# Patient Record
Sex: Male | Born: 1970
Health system: Southern US, Community
[De-identification: ages and names within clinical notes are randomized; demographics above are authoritative.]

## PROBLEM LIST (undated history)

## (undated) DIAGNOSIS — I441 Atrioventricular block, second degree: Secondary | ICD-10-CM

## (undated) DIAGNOSIS — E041 Nontoxic single thyroid nodule: Secondary | ICD-10-CM

## (undated) DIAGNOSIS — Z8639 Personal history of other endocrine, nutritional and metabolic disease: Secondary | ICD-10-CM

## (undated) DIAGNOSIS — I483 Typical atrial flutter: Secondary | ICD-10-CM

## (undated) DIAGNOSIS — M1990 Unspecified osteoarthritis, unspecified site: Secondary | ICD-10-CM

## (undated) DIAGNOSIS — I517 Cardiomegaly: Secondary | ICD-10-CM

## (undated) DIAGNOSIS — I442 Atrioventricular block, complete: Secondary | ICD-10-CM

## (undated) DIAGNOSIS — F419 Anxiety disorder, unspecified: Secondary | ICD-10-CM

## (undated) DIAGNOSIS — I429 Cardiomyopathy, unspecified: Secondary | ICD-10-CM

## (undated) DIAGNOSIS — R7303 Prediabetes: Secondary | ICD-10-CM

## (undated) HISTORY — PX: NO PAST SURGERIES: SHX2092

## (undated) HISTORY — PX: PROSTATE BIOPSY: SHX241

## (undated) HISTORY — DX: Typical atrial flutter: I48.3

## (undated) HISTORY — PX: BIOPSY THYROID: PRO38

---

## 2006-09-17 DIAGNOSIS — J309 Allergic rhinitis, unspecified: Secondary | ICD-10-CM | POA: Insufficient documentation

## 2006-09-19 DIAGNOSIS — I809 Phlebitis and thrombophlebitis of unspecified site: Secondary | ICD-10-CM | POA: Insufficient documentation

## 2007-04-12 ENCOUNTER — Ambulatory Visit: Payer: Self-pay | Admitting: Family Medicine

## 2014-02-06 LAB — LIPID PANEL
CHOLESTEROL: 164 mg/dL (ref 0–200)
HDL: 71 mg/dL — AB (ref 35–70)
LDL CALC: 86 mg/dL
LDl/HDL Ratio: 1.2
TRIGLYCERIDES: 37 mg/dL — AB (ref 40–160)

## 2014-02-06 LAB — CBC AND DIFFERENTIAL
HEMATOCRIT: 38 % — AB (ref 41–53)
Hemoglobin: 13.2 g/dL — AB (ref 13.5–17.5)
Neutrophils Absolute: 3 /uL
Platelets: 200 10*3/uL (ref 150–399)
WBC: 4.9 10*3/mL

## 2014-02-06 LAB — TSH: TSH: 1.25 u[IU]/mL (ref 0.41–5.90)

## 2014-02-10 ENCOUNTER — Ambulatory Visit: Payer: Self-pay | Admitting: Family Medicine

## 2014-02-24 ENCOUNTER — Ambulatory Visit: Payer: Self-pay | Admitting: Otolaryngology

## 2014-03-17 LAB — BASIC METABOLIC PANEL
BUN: 22 mg/dL — AB (ref 4–21)
Creatinine: 1.1 mg/dL (ref 0.6–1.3)
GLUCOSE: 96 mg/dL
POTASSIUM: 5.2 mmol/L (ref 3.4–5.3)
Sodium: 139 mmol/L (ref 137–147)

## 2014-03-17 LAB — HEPATIC FUNCTION PANEL
ALK PHOS: 53 U/L (ref 25–125)
ALT: 19 U/L (ref 10–40)
AST: 21 U/L (ref 14–40)
Bilirubin, Total: 0.4 mg/dL

## 2016-01-05 ENCOUNTER — Ambulatory Visit (INDEPENDENT_AMBULATORY_CARE_PROVIDER_SITE_OTHER): Payer: BLUE CROSS/BLUE SHIELD | Admitting: Family Medicine

## 2016-01-05 ENCOUNTER — Encounter: Payer: Self-pay | Admitting: Family Medicine

## 2016-01-05 VITALS — BP 100/68 | HR 82 | Temp 97.8°F | Resp 16 | Ht 75.0 in | Wt 205.0 lb

## 2016-01-05 DIAGNOSIS — E010 Iodine-deficiency related diffuse (endemic) goiter: Secondary | ICD-10-CM | POA: Insufficient documentation

## 2016-01-05 DIAGNOSIS — Z0000 Encounter for general adult medical examination without abnormal findings: Secondary | ICD-10-CM | POA: Diagnosis not present

## 2016-01-05 DIAGNOSIS — I517 Cardiomegaly: Secondary | ICD-10-CM | POA: Insufficient documentation

## 2016-01-05 DIAGNOSIS — Z125 Encounter for screening for malignant neoplasm of prostate: Secondary | ICD-10-CM | POA: Diagnosis not present

## 2016-01-05 DIAGNOSIS — Z23 Encounter for immunization: Secondary | ICD-10-CM

## 2016-01-05 LAB — POCT URINALYSIS DIPSTICK
Bilirubin, UA: NEGATIVE
Blood, UA: NEGATIVE
GLUCOSE UA: NEGATIVE
KETONES UA: NEGATIVE
LEUKOCYTES UA: NEGATIVE
Nitrite, UA: NEGATIVE
Protein, UA: NEGATIVE
SPEC GRAV UA: 1.025
Urobilinogen, UA: 0.2
pH, UA: 6

## 2016-01-05 NOTE — Progress Notes (Addendum)
Patient: Trevor Parker, Male    DOB: [birth year], 45 y.o.   MRN: [id number] Visit Date: 01/05/2016  Today's Provider: Megan Mans, MD   Chief Complaint  Patient presents with  . Annual Exam   Subjective:    Annual physical exam Trevor Parker is a 45 y.o. male who presents today for health maintenance and complete physical. He feels well. He reports exercising every day. He reports he is sleeping well.  -----------------------------------------------------------------   Review of Systems  Constitutional: Negative.   HENT: Negative.   Eyes: Negative.   Respiratory: Negative.   Cardiovascular: Negative.   Gastrointestinal: Negative.   Endocrine: Negative.   Genitourinary: Negative.   Musculoskeletal: Negative.   Skin: Negative.   Allergic/Immunologic: Negative.   Neurological: Negative.   Hematological: Negative.   Psychiatric/Behavioral: Negative.     Social History      He  reports that he has never smoked. He has never used smokeless tobacco. He reports that he does not drink alcohol.       Social History   Social History  . Marital status: Single    Spouse name: N/A  . Number of children: N/A  . Years of education: N/A   Social History Main Topics  . Smoking status: Never Smoker  . Smokeless tobacco: Never Used  . Alcohol use No  . Drug use: Unknown  . Sexual activity: Not Asked   Other Topics Concern  . None   Social History Narrative  . None    History reviewed. No pertinent past medical history.   Patient Active Problem List   Diagnosis Date Noted  . Big thyroid 01/05/2016  . Left ventricular hypertrophy 01/05/2016  . 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 Allergic rhinitis 09/17/2006    Past Surgical History:  Procedure Laterality Date  . NO PAST SURGERIES      Family History        Family Status  Relation Status  . Mother Alive  . Father Alive  . Brother Deceased   due to suicide        His family  history includes Breast cancer in his father; Suicidality in his brother.    No Known Allergies  No outpatient prescriptions have been marked as taking for the 01/05/16 encounter (Office Visit) with Maple Hudson., MD.    Patient Care Team: Maple Hudson., MD as PCP - General (Family Medicine)     Objective:   Vitals: BP 100/68 (BP Location: Left Arm, Patient Position: Sitting, Cuff Size: Large)   Pulse 82   Temp 97.8 F (36.6 C) (Oral)   Resp 16   Ht 6\' 3"  (1.905 m)   Wt 205 lb (93 kg)   BMI 25.62 kg/m    Physical Exam  Constitutional: He is oriented to person, place, and time. He appears well-developed and well-nourished.  HENT:  Head: Normocephalic and atraumatic.  Right Ear: External ear normal.  Left Ear: External ear normal.  Nose: Nose normal.  Mouth/Throat: Oropharynx is clear and moist.  Mild thyroid enlargement. No obvious nodules. Little bit more in the isthmus   Eyes: Conjunctivae and EOM are normal. Pupils are equal, round, and reactive to light.  Neck: Normal range of motion. Neck supple.  Cardiovascular: Normal rate, regular rhythm, normal heart sounds and intact distal pulses.   Pulmonary/Chest: Effort normal and breath sounds normal.  Abdominal: Soft. Bowel sounds are normal.  Genitourinary: Rectum normal, prostate normal and penis normal.  Musculoskeletal: Normal range of motion.  Neurological: He is alert and oriented to person, place, and time. He has normal reflexes.  Skin: Skin is warm and dry.  Psychiatric: He has a normal mood and affect. His behavior is normal. Judgment and thought content normal.     Depression Screen No flowsheet data found.    Assessment & Plan:     Routine Health Maintenance and Physical Exam  Exercise Activities and Dietary recommendations Goals    None       There is no immunization history on file for this patient.  Health Maintenance  Topic Date Due  . HIV Screening  08/27/1983  .  TETANUS/TDAP  08/27/1987  . INFLUENZA VACCINE  01/04/2016     Excellent health. Return to clinic 1 year Discussed health benefits of physical activity, and encouraged him to engage in regular exercise appropriate for his age and condition.    --------------------------------------------------------------------    Megan Mans, MD  [facility]

## 2016-01-06 LAB — PSA: PROSTATE SPECIFIC AG, SERUM: 2.7 ng/mL (ref 0.0–4.0)

## 2016-01-06 LAB — CBC WITH DIFFERENTIAL/PLATELET
BASOS: 1 %
Basophils Absolute: 0 10*3/uL (ref 0.0–0.2)
EOS (ABSOLUTE): 0.1 10*3/uL (ref 0.0–0.4)
EOS: 2 %
HEMATOCRIT: 41.6 % (ref 37.5–51.0)
Hemoglobin: 14.1 g/dL (ref 12.6–17.7)
IMMATURE GRANULOCYTES: 0 %
Immature Grans (Abs): 0 10*3/uL (ref 0.0–0.1)
LYMPHS ABS: 1.2 10*3/uL (ref 0.7–3.1)
Lymphs: 20 %
MCH: 29.7 pg (ref 26.6–33.0)
MCHC: 33.9 g/dL (ref 31.5–35.7)
MCV: 88 fL (ref 79–97)
MONOS ABS: 0.3 10*3/uL (ref 0.1–0.9)
Monocytes: 5 %
NEUTROS ABS: 4.4 10*3/uL (ref 1.4–7.0)
Neutrophils: 72 %
Platelets: 206 10*3/uL (ref 150–379)
RBC: 4.75 x10E6/uL (ref 4.14–5.80)
RDW: 14.6 % (ref 12.3–15.4)
WBC: 6.2 10*3/uL (ref 3.4–10.8)

## 2016-01-06 LAB — COMPREHENSIVE METABOLIC PANEL
ALT: 53 IU/L — AB (ref 0–44)
AST: 22 IU/L (ref 0–40)
Albumin/Globulin Ratio: 1.7 (ref 1.2–2.2)
Albumin: 4.8 g/dL (ref 3.5–5.5)
Alkaline Phosphatase: 61 IU/L (ref 39–117)
BUN/Creatinine Ratio: 22 — ABNORMAL HIGH (ref 9–20)
BUN: 25 mg/dL — AB (ref 6–24)
Bilirubin Total: 0.6 mg/dL (ref 0.0–1.2)
CALCIUM: 9.6 mg/dL (ref 8.7–10.2)
CO2: 23 mmol/L (ref 18–29)
CREATININE: 1.14 mg/dL (ref 0.76–1.27)
Chloride: 102 mmol/L (ref 96–106)
GFR, EST AFRICAN AMERICAN: 88 mL/min/{1.73_m2} (ref >59)
GFR, EST NON AFRICAN AMERICAN: 76 mL/min/{1.73_m2} (ref >59)
GLUCOSE: 89 mg/dL (ref 65–99)
Globulin, Total: 2.8 g/dL (ref 1.5–4.5)
Potassium: 4.3 mmol/L (ref 3.5–5.2)
Sodium: 143 mmol/L (ref 134–144)
TOTAL PROTEIN: 7.6 g/dL (ref 6.0–8.5)

## 2016-01-06 LAB — LIPID PANEL WITH LDL/HDL RATIO
Cholesterol, Total: 207 mg/dL — ABNORMAL HIGH (ref 100–199)
HDL: 80 mg/dL (ref >39)
LDL Calculated: 115 mg/dL — ABNORMAL HIGH (ref 0–99)
LDL/HDL RATIO: 1.4 ratio (ref 0.0–3.6)
Triglycerides: 62 mg/dL (ref 0–149)
VLDL Cholesterol Cal: 12 mg/dL (ref 5–40)

## 2016-01-06 LAB — TSH: TSH: 0.888 u[IU]/mL (ref 0.450–4.500)

## 2017-01-09 ENCOUNTER — Encounter: Payer: BLUE CROSS/BLUE SHIELD | Admitting: Family Medicine

## 2017-01-23 ENCOUNTER — Ambulatory Visit (INDEPENDENT_AMBULATORY_CARE_PROVIDER_SITE_OTHER): Payer: BLUE CROSS/BLUE SHIELD | Admitting: Family Medicine

## 2017-01-23 ENCOUNTER — Encounter: Payer: Self-pay | Admitting: Family Medicine

## 2017-01-23 VITALS — BP 102/72 | HR 76 | Temp 98.6°F | Resp 12 | Ht 76.0 in | Wt 209.0 lb

## 2017-01-23 DIAGNOSIS — Z125 Encounter for screening for malignant neoplasm of prostate: Secondary | ICD-10-CM

## 2017-01-23 DIAGNOSIS — Z0000 Encounter for general adult medical examination without abnormal findings: Secondary | ICD-10-CM | POA: Diagnosis not present

## 2017-01-23 LAB — POCT URINALYSIS DIPSTICK
BILIRUBIN UA: NEGATIVE
Blood, UA: NEGATIVE
GLUCOSE UA: NEGATIVE
KETONES UA: NEGATIVE
Leukocytes, UA: NEGATIVE
Nitrite, UA: NEGATIVE
Protein, UA: NEGATIVE
SPEC GRAV UA: 1.015 (ref 1.010–1.025)
Urobilinogen, UA: 0.2 E.U./dL
pH, UA: 7 (ref 5.0–8.0)

## 2017-01-23 NOTE — Progress Notes (Signed)
Patient: Trevor Parker, Male    DOB: 1970/11/19, 46 y.o.   MRN: [id number] Visit Date: 01/23/2017  Today's Provider: Megan Mans, MD   Chief Complaint  Patient presents with  . Annual Exam   Subjective:  Trevor Parker is a 46 y.o. male who presents today for health maintenance and complete physical. He feels well. He reports exercising daily for about 45 minutes to 1 1/2 hours depending on what he is doing ( plays basketball, running and weights. He reports he is sleeping well. Immunization History  Administered Date(s) Administered  . Tdap 01/05/2016    Review of Systems  Constitutional: Negative.   HENT: Negative.   Eyes: Negative.   Respiratory: Negative.   Cardiovascular: Negative.   Gastrointestinal: Negative.   Endocrine: Negative.   Genitourinary: Negative.   Musculoskeletal: Negative.   Skin: Negative.   Allergic/Immunologic: Positive for environmental allergies.  Neurological: Negative.   Hematological: Negative.   Psychiatric/Behavioral: Negative.     Social History   Social History  . Marital status: Single    Spouse name: N/A  . Number of children: N/A  . Years of education: N/A   Occupational History  . Not on file.   Social History Main Topics  . Smoking status: Never Smoker  . Smokeless tobacco: Never Used  . Alcohol use No  . Drug use: No  . Sexual activity: Not Currently   Other Topics Concern  . Not on file   Social History Narrative  . No narrative on file    Patient Active Problem List   Diagnosis Date Noted  . Big thyroid 01/05/2016  . Left ventricular hypertrophy 01/05/2016  . 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 Allergic rhinitis 09/17/2006    Past Surgical History:  Procedure Laterality Date  . NO PAST SURGERIES      His family history includes Breast cancer in his father; Suicidality in his brother.     Outpatient Encounter Prescriptions as of 01/23/2017  Medication Sig Note  . Multiple Vitamin  (ONE-A-DAY MENS PO) Take 1 tablet by mouth daily.   . fexofenadine (ALLEGRA) 30 MG tablet Take 30 mg by mouth daily as needed. 01/23/2017: prn   No facility-administered encounter medications on file as of 01/23/2017.     Patient Care Team: Maple Hudson., MD as PCP - General (Family Medicine)      Objective:   Vitals:  Vitals:   01/23/17 1414  BP: 102/72  Pulse: 76  Resp: 12  Temp: 98.6 F (37 C)  Weight: 209 lb (94.8 kg)  Height: 6\' 4"  (1.93 m)    Physical Exam  Constitutional: He is oriented to person, place, and time. He appears well-developed and well-nourished.  HENT:  Head: Normocephalic and atraumatic.  Right Ear: External ear normal.  Left Ear: External ear normal.  Eyes: Pupils are equal, round, and reactive to light. Conjunctivae are normal.  Neck: Normal range of motion. Neck supple.  Cardiovascular: Normal rate, regular rhythm, normal heart sounds and intact distal pulses.  Exam reveals no gallop.   No murmur heard. Pulmonary/Chest: Effort normal and breath sounds normal. No respiratory distress. He has no wheezes.  Abdominal: He exhibits no distension.  Genitourinary: Penis normal.  Genitourinary Comments: Pt declined DRE  Musculoskeletal: He exhibits no edema or tenderness.  Neurological: He is alert and oriented to person, place, and time.  Skin: No rash noted. No erythema.  Psychiatric: He has a normal mood and affect. His behavior is normal. Judgment and thought  content normal.   Depression Screen PHQ 2/9 Scores 01/23/2017  PHQ - 2 Score 0  PHQ- 9 Score 0   Assessment & Plan:    1. Annual physical exam Patient declined DRE. - CBC with Differential/Platelet - Comprehensive metabolic panel - Lipid Panel With LDL/HDL Ratio - TSH - POCT Urinalysis Dipstick  2. Prostate cancer screening - PSA  HPI, Exam and A&P transcribed by Samara Deist, RMA under direction and in the presence of Julieanne Manson, MD. I have done the exam and  reviewed the chart and it is accurate to the best of my knowledge. Dentist has been used and  any errors in dictation or transcription are unintentional. Julieanne Manson M.D. [facility]

## 2017-12-03 DIAGNOSIS — I483 Typical atrial flutter: Secondary | ICD-10-CM

## 2017-12-03 HISTORY — DX: Typical atrial flutter: I48.3

## 2017-12-13 ENCOUNTER — Ambulatory Visit (INDEPENDENT_AMBULATORY_CARE_PROVIDER_SITE_OTHER): Payer: BLUE CROSS/BLUE SHIELD | Admitting: Family Medicine

## 2017-12-13 VITALS — BP 128/82 | HR 50 | Temp 98.3°F | Resp 16 | Wt 211.0 lb

## 2017-12-13 DIAGNOSIS — Z125 Encounter for screening for malignant neoplasm of prostate: Secondary | ICD-10-CM | POA: Diagnosis not present

## 2017-12-13 DIAGNOSIS — R42 Dizziness and giddiness: Secondary | ICD-10-CM | POA: Diagnosis not present

## 2017-12-13 DIAGNOSIS — I4892 Unspecified atrial flutter: Secondary | ICD-10-CM

## 2017-12-13 NOTE — Progress Notes (Signed)
Trevor Parker  MRN: [id number] DOB: 11-02-1970  Subjective:  HPI   The patient is a 47 year old male who presents for evaluation of dizziness.  He states that since about Memorial day he has noticed that at times when goes to stand up he has d=some dizziness that lasts just a few seconds and then goes away.  He has never passed out from this but feels like he is going to for a few seconds  He also states that he is finishing up an antibiotic that he has been taking for a sinus infection. He is a Systems analyst and exercises regularly.No drugs. Overall health is excellent.  Orthostatics readings  Lying      BP  118/72   HR  58 Sitting     BP   100/64  HR  56 Standing BP  112/62   HR  58  Patient Active Problem List   Diagnosis Date Noted  . Big thyroid 01/05/2016  . Left ventricular hypertrophy 01/05/2016  . Allergic rhinitis 09/17/2006    No past medical history on file.  Social History   Socioeconomic History  . Marital status: Single    Spouse name: Not on file  . Number of children: Not on file  . Years of education: Not on file  . Highest education level: Not on file  Occupational History  . Not on file  Social Needs  . Financial resource strain: Not on file  . Food insecurity:    Worry: Not on file    Inability: Not on file  . Transportation needs:    Medical: Not on file    Non-medical: Not on file  Tobacco Use  . Smoking status: Never Smoker  . Smokeless tobacco: Never Used  Substance and Sexual Activity  . Alcohol use: No  . Drug use: No  . Sexual activity: Not Currently  Lifestyle  . Physical activity:    Days per week: Not on file    Minutes per session: Not on file  . Stress: Not on file  Relationships  . Social connections:    Talks on phone: Not on file    Gets together: Not on file    Attends religious service: Not on file    Active member of club or organization: Not on file    Attends meetings of clubs or organizations: Not on file     Relationship status: Not on file  . Intimate partner violence:    Fear of current or ex partner: Not on file    Emotionally abused: Not on file    Physically abused: Not on file    Forced sexual activity: Not on file  Other Topics Concern  . Not on file  Social History Narrative  . Not on file    Outpatient Encounter Medications as of 12/13/2017  Medication Sig Note  . fexofenadine (ALLEGRA) 30 MG tablet Take 30 mg by mouth daily as needed. 01/23/2017: prn  . Multiple Vitamin (ONE-A-DAY MENS PO) Take 1 tablet by mouth daily.    No facility-administered encounter medications on file as of 12/13/2017.     No Known Allergies  Review of Systems  Constitutional: Negative for fever and malaise/fatigue.  Eyes: Negative.   Respiratory: Negative for cough, hemoptysis, sputum production, shortness of breath and wheezing.   Cardiovascular: Positive for leg swelling (just one day very swollen). Negative for chest pain and palpitations.  Neurological: Positive for dizziness and tingling (in toes on occasion). Negative for tremors,  sensory change, focal weakness, seizures, weakness and headaches.  Psychiatric/Behavioral: Negative.     Objective:  BP 128/82 (BP Location: Right Arm, Patient Position: Sitting, Cuff Size: Normal)   Pulse (!) 50   Temp 98.3 F (36.8 C) (Oral)   Resp 16   Wt 211 lb (95.7 kg)   BMI 25.68 kg/m   Physical Exam  Constitutional: He is oriented to person, place, and time and well-developed, well-nourished, and in no distress.  HENT:  Head: Normocephalic and atraumatic.  Eyes: Conjunctivae are normal. No scleral icterus.  Neck: No thyromegaly present.  Cardiovascular: Normal rate, regular rhythm, normal heart sounds and intact distal pulses.  Pulmonary/Chest: Effort normal and breath sounds normal.  Abdominal: Soft.  Musculoskeletal: He exhibits no edema.  Lymphadenopathy:    He has no cervical adenopathy.  Neurological: He is alert and oriented to person,  place, and time. Gait normal. GCS score is 15.  Skin: Skin is warm and dry.  Psychiatric: Mood, memory, affect and judgment normal.    Assessment and Plan :  1. Dizzy  - EKG 12-Lead - CBC with Differential/Platelet - Comprehensive metabolic panel - TSH  2. Prostate cancer screening  - PSA  3. Atrial flutter, unspecified type (HCC)/new onset To see Dr Juliann Paresallwood tonmorrow. Started ASA 81 mg daily for now. - CBC with Differential/Platelet - Comprehensive metabolic panel - TSH  I have done the exam and reviewed the chart and it is accurate to the best of my knowledge. DentistDragon  technology has been used and  any errors in dictation or transcription are unintentional. Julieanne Mansonichard Hani Campusano M.D. [facility]

## 2017-12-14 LAB — CBC WITH DIFFERENTIAL/PLATELET
BASOS: 1 %
Basophils Absolute: 0 10*3/uL (ref 0.0–0.2)
EOS (ABSOLUTE): 0.2 10*3/uL (ref 0.0–0.4)
EOS: 3 %
HEMATOCRIT: 42.5 % (ref 37.5–51.0)
Hemoglobin: 13.8 g/dL (ref 13.0–17.7)
IMMATURE GRANS (ABS): 0 10*3/uL (ref 0.0–0.1)
IMMATURE GRANULOCYTES: 0 %
LYMPHS: 32 %
Lymphocytes Absolute: 1.5 10*3/uL (ref 0.7–3.1)
MCH: 28.9 pg (ref 26.6–33.0)
MCHC: 32.5 g/dL (ref 31.5–35.7)
MCV: 89 fL (ref 79–97)
Monocytes Absolute: 0.4 10*3/uL (ref 0.1–0.9)
Monocytes: 8 %
Neutrophils Absolute: 2.7 10*3/uL (ref 1.4–7.0)
Neutrophils: 56 %
PLATELETS: 181 10*3/uL (ref 150–450)
RBC: 4.77 x10E6/uL (ref 4.14–5.80)
RDW: 14.5 % (ref 12.3–15.4)
WBC: 4.8 10*3/uL (ref 3.4–10.8)

## 2017-12-14 LAB — COMPREHENSIVE METABOLIC PANEL
ALT: 59 IU/L — AB (ref 0–44)
AST: 28 IU/L (ref 0–40)
Albumin/Globulin Ratio: 1.9 (ref 1.2–2.2)
Albumin: 4.1 g/dL (ref 3.5–5.5)
Alkaline Phosphatase: 60 IU/L (ref 39–117)
BUN/Creatinine Ratio: 23 — ABNORMAL HIGH (ref 9–20)
BUN: 23 mg/dL (ref 6–24)
Bilirubin Total: 0.3 mg/dL (ref 0.0–1.2)
CALCIUM: 9 mg/dL (ref 8.7–10.2)
CO2: 21 mmol/L (ref 20–29)
CREATININE: 1.01 mg/dL (ref 0.76–1.27)
Chloride: 103 mmol/L (ref 96–106)
GFR, EST AFRICAN AMERICAN: 100 mL/min/{1.73_m2} (ref >59)
GFR, EST NON AFRICAN AMERICAN: 87 mL/min/{1.73_m2} (ref >59)
GLOBULIN, TOTAL: 2.2 g/dL (ref 1.5–4.5)
Glucose: 88 mg/dL (ref 65–99)
Potassium: 4 mmol/L (ref 3.5–5.2)
Sodium: 139 mmol/L (ref 134–144)
TOTAL PROTEIN: 6.3 g/dL (ref 6.0–8.5)

## 2017-12-14 LAB — PSA: PROSTATE SPECIFIC AG, SERUM: 3.2 ng/mL (ref 0.0–4.0)

## 2017-12-14 LAB — TSH: TSH: 0.9 u[IU]/mL (ref 0.450–4.500)

## 2018-01-28 ENCOUNTER — Encounter: Payer: BLUE CROSS/BLUE SHIELD | Admitting: Family Medicine

## 2018-01-28 ENCOUNTER — Other Ambulatory Visit: Payer: Self-pay

## 2018-01-28 DIAGNOSIS — R748 Abnormal levels of other serum enzymes: Secondary | ICD-10-CM

## 2018-01-31 ENCOUNTER — Encounter: Payer: Self-pay | Admitting: *Deleted

## 2018-01-31 ENCOUNTER — Ambulatory Visit (INDEPENDENT_AMBULATORY_CARE_PROVIDER_SITE_OTHER): Payer: BLUE CROSS/BLUE SHIELD | Admitting: Internal Medicine

## 2018-01-31 ENCOUNTER — Encounter: Payer: Self-pay | Admitting: Internal Medicine

## 2018-01-31 VITALS — BP 120/75 | HR 43 | Ht 76.0 in | Wt 206.5 lb

## 2018-01-31 DIAGNOSIS — I4892 Unspecified atrial flutter: Secondary | ICD-10-CM

## 2018-01-31 DIAGNOSIS — Z01812 Encounter for preprocedural laboratory examination: Secondary | ICD-10-CM

## 2018-01-31 MED ORDER — APIXABAN 5 MG PO TABS: 5.0000 mg | ORAL_TABLET | Freq: Two times a day (BID) | ORAL | 6 refills | Status: DC

## 2018-01-31 NOTE — Patient Instructions (Addendum)
Medication Instructions: - Your physician has recommended you make the following change in your medication:   1) START eliquis 5 mg- take 1 tablet by mouth twice daily  Samples given today Eliquis 5 mg  Lot: [id number] Exp: 6/21 # 4 boxes  Labwork: - Your physician recommends that you return for lab work : the week of 03/04/18- you will need to go to the [facility] of [facility]- 1st desk on the right to check in. Labs are done on a walk in basis. (BMP/CBC)   Procedures/Testing: - Your physician has recommended that you have an atrial flutter ablation. Catheter ablation is a medical procedure used to treat some cardiac arrhythmias (irregular heartbeats). During catheter ablation, a long, thin, flexible tube is put into a blood vessel in your groin (upper thigh), or neck. This tube is called an ablation catheter. It is then guided to your heart through the blood vessel. Radio frequency waves destroy small areas of heart tissue where abnormal heartbeats may cause an arrhythmia to start. Please see the instruction sheet given to you today.  Follow-Up: - Your physician recommends that you schedule a follow-up appointment: 4 weeks (from 03/13/18) with Dr. Graciela Husbands.   Any Additional Special Instructions Will Be Listed Below (If Applicable).     If you need a refill on your cardiac medications before your next appointment, please call your pharmacy.    Atrial Flutter Atrial flutter is a type of abnormal heart rhythm (arrhythmia). In atrial flutter, the heartbeat is fast but regular. There are two types of atrial flutter:  Paroxysmal atrial flutter. This type starts suddenly. It usually stops on its own soon after it starts.  Permanent atrial flutter. This type does not go away.  What are the causes? This condition may be caused by:  A heart condition or problem, such as: ? A heart attack. ? Heart failure. ? A heart valve problem.  A lung problem, such as: ? A blood clot in the  lungs (pulmonary embolism, or PE). ? Chronic obstructive pulmonary disease.  Poorly controlled high blood pressure (hypertension).  Hyperthyroidism.  Caffeine.  Some decongestant cold medicines.  Low levels of minerals called electrolytes in the blood.  Cocaine.  What increases the risk? This condition is more likely to develop in:  Elderly adults.  Men.  What are the signs or symptoms? Symptoms of this condition include:  A feeling that your heart is pounding or racing (palpitations).  Shortness of breath.  Chest pain.  Feeling light-headed.  Dizziness.  Fainting.  How is this diagnosed? This condition may be diagnosed with tests, including:  An electrocardiogram (ECG). This is a painless test that records electrical signals in the heart.  Holter monitoring. For this test, you wear a device that records your heartbeat for 1-2 days.  Cardiac event monitoring. For this test, you wear a device that records your heartbeat for up to 30 days.  An echocardiogram. This is a painless test that uses sound waves to make a picture of your heart.  Stress test. This test records your heartbeat while you exercise.  Blood tests.  How is this treated? This condition may be treated with:  Treatment of any underlying conditions.  Medicine to make your heart beat more slowly.  Medicine to keep the condition from coming back.  A procedure to keep the condition under control. Some procedures to do this include: ? Cardioversion. During this procedure, medicines or an electrical shock are given to make the heart beat normally. ?  Ablation. During this procedure, the heart tissue that is causing the problem is destroyed. This procedure may be done if atrial flutter lasts a long time or happens often.  Follow these instructions at home:  Take over-the-counter and prescription medicines only as told by your health care provider.  Do not take any new medicines without talking  to your health care provider.  Do not use tobacco products, including cigarettes, chewing tobacco, or e-cigarettes. If you need help quitting, ask your health care provider.  Limit alcohol intake to no more than 1 drink per day for nonpregnant women and 2 drinks per day for men. One drink equals 12 oz of beer, 5 oz of wine, or 1 oz of hard liquor.  Try to reduce any stress. Stress can make your symptoms worse. Contact a health care provider if:  Your symptoms get worse. Get help right away if:  You are dizzy.  You feel like fainting or you faint.  You have shortness of breath.  You feel pain or pressure in your chest.  You suddenly feel nauseous or you suddenly vomit.  There is a sudden change in your ability to speak, eat, or move.  You are sweating a lot for no reason. This information is not intended to replace advice given to you by your health care provider. Make sure you discuss any questions you have with your health care provider. Document Released: 10/08/2008 Document Revised: 09/29/2015 Document Reviewed: 12/04/2014 Elsevier Interactive Patient Education  2018 [employer].    Cardiac Ablation Cardiac ablation is a procedure to disable (ablate) a small amount of heart tissue in very specific places. The heart has many electrical connections. Sometimes these connections are abnormal and can cause the heart to beat very fast or irregularly. Ablating some of the problem areas can improve the heart rhythm or return it to normal. Ablation may be done for people who:  Have Wolff-Parkinson-White syndrome.  Have fast heart rhythms (tachycardia).  Have taken medicines for an abnormal heart rhythm (arrhythmia) that were not effective or caused side effects.  Have a high-risk heartbeat that may be life-threatening.  During the procedure, a small incision is made in the neck or the groin, and a long, thin, flexible tube (catheter) is inserted into the incision and moved to  the heart. Small devices (electrodes) on the tip of the catheter will send out electrical currents. A type of X-ray (fluoroscopy) will be used to help guide the catheter and to provide images of the heart. Tell a health care provider about:  Any allergies you have.  All medicines you are taking, including vitamins, herbs, eye drops, creams, and over-the-counter medicines.  Any problems you or family members have had with anesthetic medicines.  Any blood disorders you have.  Any surgeries you have had.  Any medical conditions you have, such as kidney failure.  Whether you are pregnant or may be pregnant. What are the risks? Generally, this is a safe procedure. However, problems may occur, including:  Infection.  Bruising and bleeding at the catheter insertion site.  Bleeding into the chest, especially into the sac that surrounds the heart. This is a serious complication.  Stroke or blood clots.  Damage to other structures or organs.  Allergic reaction to medicines or dyes.  Need for a permanent pacemaker if the normal electrical system is damaged. A pacemaker is a small computer that sends electrical signals to the heart and helps your heart beat normally.  The procedure not being fully  effective. This may not be recognized until months later. Repeat ablation procedures are sometimes required.  What happens before the procedure?  Follow instructions from your health care provider about eating or drinking restrictions.  Ask your health care provider about: ? Changing or stopping your regular medicines. This is especially important if you are taking diabetes medicines or blood thinners. ? Taking medicines such as aspirin and ibuprofen. These medicines can thin your blood. Do not take these medicines before your procedure if your health care provider instructs you not to.  Plan to have someone take you home from the hospital or clinic.  If you will be going home right after  the procedure, plan to have someone with you for 24 hours. What happens during the procedure?  To lower your risk of infection: ? Your health care team will wash or sanitize their hands. ? Your skin will be washed with soap. ? Hair may be removed from the incision area.  An IV tube will be inserted into one of your veins.  You will be given a medicine to help you relax (sedative).  The skin on your neck or groin will be numbed.  An incision will be made in your neck or your groin.  A needle will be inserted through the incision and into a large vein in your neck or groin.  A catheter will be inserted into the needle and moved to your heart.  Dye may be injected through the catheter to help your surgeon see the area of the heart that needs treatment.  Electrical currents will be sent from the catheter to ablate heart tissue in desired areas. There are three types of energy that may be used to ablate heart tissue: ? Heat (radiofrequency energy). ? Laser energy. ? Extreme cold (cryoablation).  When the necessary tissue has been ablated, the catheter will be removed.  Pressure will be held on the catheter insertion area to prevent excessive bleeding.  A bandage (dressing) will be placed over the catheter insertion area. The procedure may vary among health care providers and hospitals. What happens after the procedure?  Your blood pressure, heart rate, breathing rate, and blood oxygen level will be monitored until the medicines you were given have worn off.  Your catheter insertion area will be monitored for bleeding. You will need to lie still for a few hours to ensure that you do not bleed from the catheter insertion area.  Do not drive for 24 hours or as long as directed by your health care provider. Summary  Cardiac ablation is a procedure to disable (ablate) a small amount of heart tissue in very specific places. Ablating some of the problem areas can improve the heart  rhythm or return it to normal.  During the procedure, electrical currents will be sent from the catheter to ablate heart tissue in desired areas. This information is not intended to replace advice given to you by your health care provider. Make sure you discuss any questions you have with your health care provider. Document Released: 10/08/2008 Document Revised: 04/10/2016 Document Reviewed: 04/10/2016 Elsevier Interactive Patient Education  [employer]2018 [employer]

## 2018-01-31 NOTE — Progress Notes (Signed)
ELECTROPHYSIOLOGY CONSULT NOTE  Patient ID: Trevor Parker, MRN: [id number], DOB/AGE: 1970/12/14 47 y.o. Admit date: (Not on file) Date of Consult: 01/31/2018  Primary Physician: Maple Hudson., MD Primary Cardiologist: Brayton Layman Colucci is a 47 y.o. male who is being seen today for the evaluation of Atrial flutte at the request of Dr DC.    HPI Trevor Parker is a 47 y.o. male referred because of persistence of atrial flutter.  This was identified because of symptoms of lightheadedness prompting a visit to his PCP in early July.  Mem[facility] Day weekend he also had presyncope.  He has had no palpitations.  He is extremely fit and has noted no changes in his exercise tolerance.  Thromboembolic risk factors ( all neg) for a CHADSVASc Score of 0    DATE TEST EF   7/19 Echo   55 % LA 44mm; MR mild-mod              Date Cr Hgb  7/19 1.01 13.8       TSH 0.9     Past Medical History:  Diagnosis Date  . Typical atrial flutter [facility])       Surgical History:  Past Surgical History:  Procedure Laterality Date  . NO PAST SURGERIES       Home Meds: Prior to Admission medications   Medication Sig Start Date End Date Taking? Authorizing Provider  fexofenadine (ALLEGRA) 30 MG tablet Take 30 mg by mouth daily as needed.   Yes [provider]  IBUPROFEN PO Take by mouth as needed.   Yes [provider]  Multiple Vitamin (ONE-A-DAY MENS PO) Take 1 tablet by mouth daily.   Yes [provider]    Allergies: No Known Allergies  Social History   Socioeconomic History  . Marital status: Single    Spouse name: Not on file  . Number of children: Not on file  . Years of education: Not on file  . Highest education level: Not on file  Occupational History  . Not on file  Social Needs  . Financial resource strain: Not on file  . Food insecurity:    Worry: Not on file    Inability: Not on file  . Transportation needs:   Medical: Not on file    Non-medical: Not on file  Tobacco Use  . Smoking status: Never Smoker  . Smokeless tobacco: Never Used  Substance and Sexual Activity  . Alcohol use: No  . Drug use: No  . Sexual activity: Not Currently  Lifestyle  . Physical activity:    Days per week: Not on file    Minutes per session: Not on file  . Stress: Not on file  Relationships  . Social connections:    Talks on phone: Not on file    Gets together: Not on file    Attends religious service: Not on file    Active member of club or organization: Not on file    Attends meetings of clubs or organizations: Not on file    Relationship status: Not on file  . Intimate partner violence:    Fear of current or ex partner: Not on file    Emotionally abused: Not on file    Physically abused: Not on file    Forced sexual activity: Not on file  Other Topics Concern  . Not on file  Social History Narrative  . Not on file  Family History  Problem Relation Age of Onset  . Breast cancer Father   . Suicidality Brother      ROS:  Please see the history of present illness.     All other systems reviewed and negative.    Physical Exam: Blood pressure 120/75, pulse (!) 43, height 6\' 4"  (1.93 m), weight 206 lb 8 oz (93.7 kg). General: Well developed, well nourished male in no acute distress. Head: Normocephalic, atraumatic, sclera non-icteric, no xanthomas, nares are without discharge. EENT: normal  Lymph Nodes:  none Neck: Negative for carotid bruits. JVD not elevated. Back:without scoliosis kyphosis Lungs: Clear bilaterally to auscultation without wheezes, rales, or rhonchi. Breathing is unlabored. Heart: RRR with S1 S2. No  murmur . No rubs, or gallops appreciated. Abdomen: Soft, non-tender, non-distended with normoactive bowel sounds. No hepatomegaly. No rebound/guarding. No obvious abdominal masses. Msk:  Strength and tone appear normal for age. Extremities: No clubbing or cyanosis. No edema.   Distal pedal pulses are 2+ and equal bilaterally. Skin: Warm and Dry Neuro: Alert and oriented X 3. CN III-XII intact Grossly normal sensory and motor function . Psych:  Responds to questions appropriately with a normal affect.      Labs: Cardiac Enzymes No results for input(s): CKTOTAL, CKMB, TROPONINI in the last 72 hours. CBC Lab Results  Component Value Date   WBC 4.8 12/13/2017   HGB 13.8 12/13/2017   HCT 42.5 12/13/2017   MCV 89 12/13/2017   PLT 181 12/13/2017   PROTIME: No results for input(s): LABPROT, INR in the last 72 hours. Chemistry No results for input(s): NA, K, CL, CO2, BUN, CREATININE, CALCIUM, PROT, BILITOT, ALKPHOS, ALT, AST, GLUCOSE in the last 168 hours.  Invalid input(s): LABALBU Lipids Lab Results  Component Value Date   CHOL 207 (H) 01/05/2016   HDL 80 01/05/2016   LDLCALC 115 (H) 01/05/2016   TRIG 62 01/05/2016   BNP No results found for: PROBNP Thyroid Function Tests: No results for input(s): TSH, T4TOTAL, T3FREE, THYROIDAB in the last 72 hours.  Invalid input(s): FREET3 Miscellaneous No results found for: DDIMER  Radiology/Studies:  No results found.  EKG: 7/19 typical atrial flutter   Assessment and Plan:  Atrial flutter typical   The patient has typical atrial flutter.  His CHADS-VASc score is 0.  Still, the likelihood of a stroke is greater than the likelihood of the major complication with the procedure and hence, recommend catheter ablation of primary therapy for his atrial flutter.  We have discussed the risks and benefits of the procedure including but not limited to death vascular injury and block requiring pacemaker implantation.  We discussed to alternative strategies, TEE/ablation or 3 weeks/ablation.  As he has minimal symptoms, we have elected to pursue the latter.  We have discussed the risk of anticoagulation       Sherryl MangesSteven Klein

## 2018-02-05 ENCOUNTER — Telehealth: Payer: Self-pay | Admitting: Internal Medicine

## 2018-02-05 NOTE — Telephone Encounter (Signed)
I called and spoke with the patient. I answered his questions regarding ablation.  He would like for me to check and see if 9/25 could be an option for his ablation. I advised I would check with the hospital and let him know tomorrow.  He voices understanding and is agreeable.

## 2018-02-05 NOTE — Telephone Encounter (Signed)
Patient calling  Patient saw Dr. Graciela Husbands on 8/29 and would like to speak with [name] questions in regards to ablation Please call to discuss

## 2018-02-06 NOTE — Telephone Encounter (Signed)
I spoke with the cath lab this morning and confirmed only 1 anesthesia case on Wednesdays. I have called the patient and advised him that as of right now we will need to leave his flutter ablation as scheduled on 03/13/18. The patient voices understanding and is agreeable.

## 2018-03-07 ENCOUNTER — Other Ambulatory Visit
Admission: RE | Admit: 2018-03-07 | Discharge: 2018-03-07 | Disposition: A | Discharge status: Home / Self Care | Payer: BLUE CROSS/BLUE SHIELD | Source: Ambulatory Visit | Attending: Internal Medicine | Admitting: Internal Medicine

## 2018-03-07 DIAGNOSIS — Z01812 Encounter for preprocedural laboratory examination: Secondary | ICD-10-CM | POA: Insufficient documentation

## 2018-03-07 DIAGNOSIS — I4892 Unspecified atrial flutter: Secondary | ICD-10-CM | POA: Diagnosis not present

## 2018-03-07 LAB — BASIC METABOLIC PANEL
Anion gap: 7 (ref 5–15)
BUN: 26 mg/dL — AB (ref 6–20)
CO2: 26 mmol/L (ref 22–32)
Calcium: 8.9 mg/dL (ref 8.9–10.3)
Chloride: 110 mmol/L (ref 98–111)
Creatinine, Ser: 1.03 mg/dL (ref 0.61–1.24)
GFR calc Af Amer: >60 mL/min (ref >60)
GLUCOSE: 102 mg/dL — AB (ref 70–99)
POTASSIUM: 4.5 mmol/L (ref 3.5–5.1)
Sodium: 143 mmol/L (ref 135–145)

## 2018-03-07 LAB — CBC WITH DIFFERENTIAL/PLATELET
BASOS ABS: 0.1 10*3/uL (ref 0–0.1)
Basophils Relative: 1 %
EOS PCT: 3 %
Eosinophils Absolute: 0.1 10*3/uL (ref 0–0.7)
HCT: 40.1 % (ref 40.0–52.0)
Hemoglobin: 14 g/dL (ref 13.0–18.0)
Lymphocytes Relative: 23 %
Lymphs Abs: 1.3 10*3/uL (ref 1.0–3.6)
MCH: 31.9 pg (ref 26.0–34.0)
MCHC: 35 g/dL (ref 32.0–36.0)
MCV: 91.2 fL (ref 80.0–100.0)
MONO ABS: 0.4 10*3/uL (ref 0.2–1.0)
Monocytes Relative: 7 %
Neutro Abs: 3.7 10*3/uL (ref 1.4–6.5)
Neutrophils Relative %: 66 %
PLATELETS: 176 10*3/uL (ref 150–440)
RBC: 4.4 MIL/uL (ref 4.40–5.90)
RDW: 14.5 % (ref 11.5–14.5)
WBC: 5.5 10*3/uL (ref 3.8–10.6)

## 2018-03-12 ENCOUNTER — Other Ambulatory Visit: Payer: Self-pay | Admitting: Internal Medicine

## 2018-03-12 DIAGNOSIS — I483 Typical atrial flutter: Secondary | ICD-10-CM

## 2018-03-12 NOTE — Anesthesia Preprocedure Evaluation (Addendum)
Anesthesia Evaluation  Patient identified by MRN, date of birth, ID band Patient awake    Reviewed: Allergy & Precautions, H&P , NPO status , Patient's Chart, lab work & pertinent test results  Airway Mallampati: II  TM Distance: >3 FB Neck ROM: Full    Dental no notable dental hx. (+) Teeth Intact, Dental Advisory Given   Pulmonary neg pulmonary ROS,    Pulmonary exam normal breath sounds clear to auscultation       Cardiovascular Exercise Tolerance: Good + dysrhythmias Atrial Fibrillation  Rhythm:Regular Rate:Normal     Neuro/Psych negative neurological ROS  negative psych ROS   GI/Hepatic negative GI ROS, Neg liver ROS,   Endo/Other  negative endocrine ROS  Renal/GU negative Renal ROS  negative genitourinary   Musculoskeletal   Abdominal   Peds  Hematology negative hematology ROS (+)   Anesthesia Other Findings   Reproductive/Obstetrics negative OB ROS                            Anesthesia Physical Anesthesia Plan  ASA: II  Anesthesia Plan: General   Post-op Pain Management:    Induction: Intravenous  PONV Risk Score and Plan: 3 and Ondansetron, Dexamethasone and Midazolam  Airway Management Planned: LMA and Oral ETT  Additional Equipment:   Intra-op Plan:   Post-operative Plan: Extubation in OR  Informed Consent: I have reviewed the patients History and Physical, chart, labs and discussed the procedure including the risks, benefits and alternatives for the proposed anesthesia with the patient or authorized representative who has indicated his/her understanding and acceptance.   Dental advisory given  Plan Discussed with: CRNA  Anesthesia Plan Comments:         Anesthesia Quick Evaluation

## 2018-03-13 ENCOUNTER — Ambulatory Visit (HOSPITAL_COMMUNITY): Payer: BLUE CROSS/BLUE SHIELD | Admitting: Anesthesiology

## 2018-03-13 ENCOUNTER — Encounter (HOSPITAL_COMMUNITY): Payer: Self-pay | Admitting: Certified Registered Nurse Anesthetist

## 2018-03-13 ENCOUNTER — Encounter (HOSPITAL_COMMUNITY)
Admission: RE | Disposition: A | Discharge status: Home / Self Care | Payer: Self-pay | Source: Ambulatory Visit | Attending: Internal Medicine

## 2018-03-13 ENCOUNTER — Other Ambulatory Visit: Payer: Self-pay

## 2018-03-13 ENCOUNTER — Ambulatory Visit (HOSPITAL_COMMUNITY)
Admission: RE | Admit: 2018-03-13 | Discharge: 2018-03-13 | Disposition: A | Discharge status: Home / Self Care | Payer: BLUE CROSS/BLUE SHIELD | Source: Ambulatory Visit | Attending: Internal Medicine | Admitting: Internal Medicine

## 2018-03-13 DIAGNOSIS — Z7901 Long term (current) use of anticoagulants: Secondary | ICD-10-CM | POA: Diagnosis not present

## 2018-03-13 DIAGNOSIS — I483 Typical atrial flutter: Secondary | ICD-10-CM

## 2018-03-13 DIAGNOSIS — R001 Bradycardia, unspecified: Secondary | ICD-10-CM | POA: Insufficient documentation

## 2018-03-13 HISTORY — PX: A-FLUTTER ABLATION: EP1230

## 2018-03-13 SURGERY — A-FLUTTER ABLATION
Anesthesia: General

## 2018-03-13 MED ORDER — ACETAMINOPHEN 325 MG PO TABS
650.0000 mg | ORAL_TABLET | ORAL | Status: DC | PRN
  Filled 2018-03-13: qty 2

## 2018-03-13 MED ORDER — MIDAZOLAM HCL 5 MG/5ML IJ SOLN
INTRAMUSCULAR | Status: DC | PRN
  Administered 2018-03-13: 2 mg via INTRAVENOUS

## 2018-03-13 MED ORDER — HEPARIN (PORCINE) IN NACL 1000-0.9 UT/500ML-% IV SOLN
INTRAVENOUS | Status: AC
  Filled 2018-03-13: qty 500

## 2018-03-13 MED ORDER — BUPIVACAINE HCL (PF) 0.25 % IJ SOLN
INTRAMUSCULAR | Status: AC
  Filled 2018-03-13: qty 30

## 2018-03-13 MED ORDER — SODIUM CHLORIDE 0.9% FLUSH: 3.0000 mL | Freq: Two times a day (BID) | INTRAVENOUS | Status: DC

## 2018-03-13 MED ORDER — ONDANSETRON HCL 4 MG/2ML IJ SOLN
INTRAMUSCULAR | Status: DC | PRN
  Administered 2018-03-13: 4 mg via INTRAVENOUS

## 2018-03-13 MED ORDER — SODIUM CHLORIDE 0.9% FLUSH: 3.0000 mL | INTRAVENOUS | Status: DC | PRN

## 2018-03-13 MED ORDER — PROPOFOL 10 MG/ML IV BOLUS
INTRAVENOUS | Status: DC | PRN
  Administered 2018-03-13: 160 mg via INTRAVENOUS

## 2018-03-13 MED ORDER — BUPIVACAINE HCL (PF) 0.25 % IJ SOLN
INTRAMUSCULAR | Status: DC | PRN
  Administered 2018-03-13: 30 mL

## 2018-03-13 MED ORDER — DEXAMETHASONE SODIUM PHOSPHATE 10 MG/ML IJ SOLN
INTRAMUSCULAR | Status: DC | PRN
  Administered 2018-03-13: 10 mg via INTRAVENOUS

## 2018-03-13 MED ORDER — ONDANSETRON HCL 4 MG/2ML IJ SOLN: 4.0000 mg | Freq: Four times a day (QID) | INTRAMUSCULAR | Status: DC | PRN

## 2018-03-13 MED ORDER — LIDOCAINE 2% (20 MG/ML) 5 ML SYRINGE
INTRAMUSCULAR | Status: DC | PRN
  Administered 2018-03-13: 100 mg via INTRAVENOUS

## 2018-03-13 MED ORDER — SODIUM CHLORIDE 0.9 % IV SOLN
INTRAVENOUS | Status: DC
  Administered 2018-03-13: 12:00:00 via INTRAVENOUS

## 2018-03-13 MED ORDER — EPHEDRINE SULFATE-NACL 50-0.9 MG/10ML-% IV SOSY
PREFILLED_SYRINGE | INTRAVENOUS | Status: DC | PRN
  Administered 2018-03-13: 10 mg via INTRAVENOUS

## 2018-03-13 MED ORDER — SODIUM CHLORIDE 0.9 % IV SOLN: 250.0000 mL | INTRAVENOUS | Status: DC | PRN

## 2018-03-13 MED ORDER — HEPARIN (PORCINE) IN NACL 1000-0.9 UT/500ML-% IV SOLN
INTRAVENOUS | Status: DC | PRN
  Administered 2018-03-13: 500 mL

## 2018-03-13 MED ORDER — FENTANYL CITRATE (PF) 100 MCG/2ML IJ SOLN
INTRAMUSCULAR | Status: DC | PRN
  Administered 2018-03-13 (×2): 25 ug via INTRAVENOUS
  Administered 2018-03-13: 50 ug via INTRAVENOUS

## 2018-03-13 MED ORDER — SODIUM CHLORIDE 0.9 % IV SOLN
INTRAVENOUS | Status: DC
  Administered 2018-03-13 (×2): via INTRAVENOUS

## 2018-03-13 SURGICAL SUPPLY — 11 items
CATH BLAZERPRIME XP LG CV 10MM (ABLATOR) ×2 IMPLANT
CATH DUODECA HALO/ISMUS 7FR (CATHETERS) ×2 IMPLANT
CATH OCTAPOLOR 6F 125CM 2-5-2 (CATHETERS) ×2 IMPLANT
CATH QUAD COURNAND 5FR (CATHETERS) ×2 IMPLANT
PACK EP LATEX FREE (CUSTOM PROCEDURE TRAY) ×1
PACK EP LF (CUSTOM PROCEDURE TRAY) ×1 IMPLANT
PAD PRO RADIOLUCENT 2001M-C (PAD) ×2 IMPLANT
SHEATH ATRIAL FLUTTER SAFL 8F (SHEATH) ×2 IMPLANT
SHEATH PINNACLE 6F 10CM (SHEATH) ×2 IMPLANT
SHEATH PINNACLE 7F 10CM (SHEATH) ×2 IMPLANT
SHEATH PINNACLE 8F 10CM (SHEATH) ×4 IMPLANT

## 2018-03-13 NOTE — Progress Notes (Addendum)
Patient observed to have Mobitz I, HR 60's, good BP No symptoms. Only baseline EKGs to compare are AFlutter.   Procedure note reviewed, post ablation PR .  I do not see any evidence of high block mentioned Monitor rhythm closely Will make Dr. Graciela Husbands aware   Francis Dowse, PA-C  Dr. Graciela Husbands was made aware of Mobitz I Patient remained asymptomatic, VSS Dr. Graciela Husbands has seen the patient, reviewed activity restrictions/care instructions with the patient  Cleared for discharge  Routine follow up is  In place  Francis Dowse, [place]

## 2018-03-13 NOTE — H&P (Signed)
      Patient Care Team: Maple Hudson., MD as PCP - General (Family Medicine)   HPI  Trevor Parker is a 47 y.o. male Admitted for atrial flutter with CHADSVASC score of 0;  On apixoban for RFCA Without chest pain or sob or bleeding Took apixoban this am  Records and Results Reviewed   Past Medical History:  Diagnosis Date  . Typical atrial flutter [facility])     Past Surgical History:  Procedure Laterality Date  . NO PAST SURGERIES      Current Facility-Administered Medications  Medication Dose Route Frequency Provider Last Rate Last Dose  . 0.9 %  sodium chloride infusion   Intravenous Continuous Duke Salvia, MD 50 mL/hr at 03/13/18 [phone]      No Known Allergies    Social History   Tobacco Use  . Smoking status: Never Smoker  . Smokeless tobacco: Never Used  Substance Use Topics  . Alcohol use: No  . Drug use: No     Family History  Problem Relation Age of Onset  . Breast cancer Father   . Suicidality Brother      Current Meds  Medication Sig  . apixaban (ELIQUIS) 5 MG TABS tablet Take 1 tablet (5 mg total) by mouth 2 (two) times daily.  . fexofenadine (ALLEGRA) 30 MG tablet Take 30 mg by mouth daily as needed (Allergies).   Marland Kitchen ibuprofen (ADVIL,MOTRIN) 200 MG tablet Take 600 mg by mouth every 8 (eight) hours as needed (pain).      Review of Systems negative except from HPI and PMH  Physical Exam BP 116/84 (BP Location: Right Arm)   Pulse (!) 48   Temp (!) 97.5 F (36.4 C) (Oral)   Ht 6\' 4"  (1.93 m)   Wt 91.6 kg   SpO2 99%   BMI 24.59 kg/m  Well developed and well nourished in no acute distress HENT normal E scleral and icterus clear Neck Supple JVP flat flutter waves evident; carotids brisk and full Clear to ausculation Slow Regular rate and rhythm, no murmurs gallops or rub Soft with active bowel sounds No clubbing cyanosis  Edema Alert and oriented, grossly normal motor and sensory function Skin Warm and Dry       Assessment and Plan:  Atrial flutter typical  For RFCA today  Risks and reviewed  Plan to proceed

## 2018-03-13 NOTE — Progress Notes (Signed)
PA Renee called and informed Mohd Clemons, RN that Dr. Graciela Husbands is aware of rhythm and at this time it is okay for patient to eat.

## 2018-03-13 NOTE — Progress Notes (Addendum)
Site area: RFV x 2--LFV x 2 Site Prior to Removal:  Level 0/0 Pressure Applied For:15 min each side Manual:   yes Patient Status During Pull:  stable Post Pull Site:  Level 0/0 Post Pull Instructions Given:  yes Post Pull Pulses Present: -palpable Dressing Applied:  clear Bedrest begins @ 1040 till 1640 Comments: left side removed by Jannet Mantis

## 2018-03-13 NOTE — Progress Notes (Addendum)
PA Renee notified of rhythm change. Patient can drink, no food or meds to be given until notified

## 2018-03-13 NOTE — Progress Notes (Signed)
Successful catheter ablation of atrial flutter  Anticipate discharge today   anticoagulation x 4 weeks

## 2018-03-13 NOTE — Anesthesia Postprocedure Evaluation (Signed)
Anesthesia Post Note  Patient: Trevor Parker  Procedure(s) Performed: A-FLUTTER ABLATION (N/A )     Patient location during evaluation: PACU Anesthesia Type: General Level of consciousness: awake and alert Pain management: pain level controlled Vital Signs Assessment: post-procedure vital signs reviewed and stable Respiratory status: spontaneous breathing, nonlabored ventilation and respiratory function stable Cardiovascular status: blood pressure returned to baseline and stable Postop Assessment: no apparent nausea or vomiting Anesthetic complications: no    Last Vitals:  Vitals:   03/13/18 1035 03/13/18 1036  BP: 126/85   Pulse: 69   Resp: 10   Temp:  36.5 C  SpO2: 98%     Last Pain:  Vitals:   03/13/18 1036  TempSrc: Temporal  PainSc:                  Audreana Hancox,W. EDMOND

## 2018-03-13 NOTE — Progress Notes (Signed)
Patient transferred from procedure to short stay.  PA Renee paged about rhythm showing second degree AV block.

## 2018-03-13 NOTE — Transfer of Care (Signed)
Immediate Anesthesia Transfer of Care Note  Patient: Trevor Parker  Procedure(s) Performed: A-FLUTTER ABLATION (N/A )  Patient Location: PACU  Anesthesia Type:General  Level of Consciousness: patient cooperative and responds to stimulation  Airway & Oxygen Therapy: Patient Spontanous Breathing and Patient connected to nasal cannula oxygen  Post-op Assessment: Report given to RN, Post -op Vital signs reviewed and stable and Patient moving all extremities X 4  Post vital signs: Reviewed and stable  Last Vitals:  Vitals Value Taken Time  BP 119/87 03/13/2018 10:06 AM  Temp    Pulse 74 03/13/2018 10:08 AM  Resp 14 03/13/2018 10:08 AM  SpO2 100 % 03/13/2018 10:08 AM  Vitals shown include unvalidated device data.  Last Pain:  Vitals:   03/13/18 0647  TempSrc:   PainSc: 0-No pain         Complications: No apparent anesthesia complications

## 2018-03-13 NOTE — Discharge Instructions (Signed)
Femoral Site Care Refer to this sheet in the next few weeks. These instructions provide you with information about caring for yourself after your procedure. Your health care provider may also give you more specific instructions. Your treatment has been planned according to current medical practices, but problems sometimes occur. Call your health care provider if you have any problems or questions after your procedure. What can I expect after the procedure? After your procedure, it is typical to have the following:  Bruising at the site that usually fades within 1-2 weeks.  Blood collecting in the tissue (hematoma) that may be painful to the touch. It should usually decrease in size and tenderness within 1-2 weeks.  Follow these instructions at home:  Take medicines only as directed by your health care provider.  You may shower 24-48 hours after the procedure or as directed by your health care provider. Remove the bandage (dressing) and gently wash the site with plain soap and water. Pat the area dry with a clean towel. Do not rub the site, because this may cause bleeding.  Do not take baths, swim, or use a hot tub until your health care provider approves.  Check your insertion site every day for redness, swelling, or drainage.  Do not apply powder or lotion to the site.  Limit use of stairs to twice a day for the first 2-3 days or as directed by your health care provider.  Do not squat for the first 2-3 days or as directed by your health care provider.  Do not lift over 10 lb (4.5 kg) for 5 days after your procedure or as directed by your health care provider.  Ask your health care provider when it is okay to: ? Return to work or school. ? Resume usual physical activities or sports. ? Resume sexual activity.  Do not drive home if you are discharged the same day as the procedure. Have someone else drive you.  You may drive 24 hours after the procedure unless otherwise instructed by  your health care provider.  Do not operate machinery or power tools for 24 hours after the procedure or as directed by your health care provider.  If your procedure was done as an outpatient procedure, which means that you went home the same day as your procedure, a responsible adult should be with you for the first 24 hours after you arrive home.  Keep all follow-up visits as directed by your health care provider. This is important. Contact a health care provider if:  You have a fever.  You have chills.  You have increased bleeding from the site. Hold pressure on the site. Get help right away if:  You have unusual pain at the site.  You have redness, warmth, or swelling at the site.  You have drainage (other than a small amount of blood on the dressing) from the site.  The site is bleeding, and the bleeding does not stop after 30 minutes of holding steady pressure on the site.  Your leg or foot becomes pale, cool, tingly, or numb. This information is not intended to replace advice given to you by your health care provider. Make sure you discuss any questions you have with your health care provider. Document Released: 01/23/2014 Document Revised: 10/28/2015 Document Reviewed: 12/09/2013 Elsevier Interactive Patient Education  2018 [place] procedure care instructions No driving for 4 days. No lifting over 5 lbs for 1 week. No vigorous or sexual activity for 1 week. You may  return to work on 03/20/18. Keep procedure site clean & dry. If you notice increased pain, swelling, bleeding or pus, call/return!  You may shower, but no soaking baths/hot tubs/pools for 1 week.

## 2018-04-16 ENCOUNTER — Telehealth: Payer: Self-pay | Admitting: Internal Medicine

## 2018-04-16 NOTE — Telephone Encounter (Signed)
I called and spoke with the patient. I offered him an appointment on 04/25/18 at 9:00 am with Dr. Graciela HusbandsKlein.  He is agreeable. He is s/p flutter ablation follow up.

## 2018-04-16 NOTE — Telephone Encounter (Signed)
Patient calling Patient was scheduled 11/14 to see Dr. Graciela HusbandsKlein Will be out of town due to a funeral and has to reschedule Patient is scheduled for next available on 1/9 Would like to speak with nurse if this date will be ok or if he will need a sooner appointment  Please call to discuss

## 2018-04-18 ENCOUNTER — Ambulatory Visit: Payer: BLUE CROSS/BLUE SHIELD | Admitting: Internal Medicine

## 2018-04-25 ENCOUNTER — Encounter: Payer: Self-pay | Admitting: Internal Medicine

## 2018-04-25 ENCOUNTER — Ambulatory Visit (INDEPENDENT_AMBULATORY_CARE_PROVIDER_SITE_OTHER): Payer: BLUE CROSS/BLUE SHIELD | Admitting: Internal Medicine

## 2018-04-25 VITALS — BP 122/84 | HR 56 | Ht 76.0 in | Wt 207.8 lb

## 2018-04-25 DIAGNOSIS — I4892 Unspecified atrial flutter: Secondary | ICD-10-CM | POA: Diagnosis not present

## 2018-04-25 DIAGNOSIS — I483 Typical atrial flutter: Secondary | ICD-10-CM

## 2018-04-25 DIAGNOSIS — I517 Cardiomegaly: Secondary | ICD-10-CM

## 2018-04-25 NOTE — Patient Instructions (Signed)
Medication Instructions:  Your physician has recommended you make the following change in your medication:  1- STOP Eliquis.  If you need a refill on your cardiac medications before your next appointment, please call your pharmacy.   Lab work: none If you have labs (blood work) drawn today and your tests are completely normal, you will receive your results only by: Marland Kitchen. MyChart Message (if you have MyChart) OR . A paper copy in the mail If you have any lab test that is abnormal or we need to change your treatment, we will call you to review the results.  Testing/Procedures: Your physician has requested that you have an echocardiogram. Echocardiography is a painless test that uses sound waves to create images of your heart. It provides your doctor with information about the size and shape of your heart and how well your heart's chambers and valves are working. This procedure takes approximately one hour. There are no restrictions for this procedure. You may get an IV, if needed, to receive an ultrasound enhancing agent through to better visualize your heart.     Follow-Up: At [facility], you and your health needs are our priority.  As part of our continuing mission to provide you with exceptional heart care, we have created designated Provider Care Teams.  These Care Teams include your primary Cardiologist (physician) and Advanced Practice Providers (APPs -  Physician Assistants and Nurse Practitioners) who all work together to provide you with the care you need, when you need it. You will need a follow up appointment in 8-10 weeks.  Please call our office 2 months in advance to schedule this appointment.  You may see DR Sherryl MangesSTEVEN KLEIN.    Echocardiogram An echocardiogram, or echocardiography, uses sound waves (ultrasound) to produce an image of your heart. The echocardiogram is simple, painless, obtained within a short period of time, and offers valuable information to your health care  provider. The images from an echocardiogram can provide information such as:  Evidence of coronary artery disease (CAD).  Heart size.  Heart muscle function.  Heart valve function.  Aneurysm detection.  Evidence of a past heart attack.  Fluid buildup around the heart.  Heart muscle thickening.  Assess heart valve function.  Tell a health care provider about:  Any allergies you have.  All medicines you are taking, including vitamins, herbs, eye drops, creams, and over-the-counter medicines.  Any problems you or family members have had with anesthetic medicines.  Any blood disorders you have.  Any surgeries you have had.  Any medical conditions you have.  Whether you are pregnant or may be pregnant. What happens before the procedure? No special preparation is needed. Eat and drink normally. What happens during the procedure?  In order to produce an image of your heart, gel will be applied to your chest and a wand-like tool (transducer) will be moved over your chest. The gel will help transmit the sound waves from the transducer. The sound waves will harmlessly bounce off your heart to allow the heart images to be captured in real-time motion. These images will then be recorded.  You may need an IV to receive a medicine that improves the quality of the pictures. What happens after the procedure? You may return to your normal schedule including diet, activities, and medicines, unless your health care provider tells you otherwise. This information is not intended to replace advice given to you by your health care provider. Make sure you discuss any questions you have with your  health care provider. Document Released: 05/19/2000 Document Revised: 01/08/2016 Document Reviewed: 01/27/2013 Elsevier Interactive Patient Education  2017 [employer].

## 2018-04-25 NOTE — Progress Notes (Signed)
      Patient Care Team: Maple HudsonGilbert, Richard L Jr., MD as PCP - General (Family Medicine)   HPI  Trevor HamsCornelius Hitz is a 47 y.o. male Seen following ablation of typical atrial flutter 10/19  DATE TEST EF   7/19 Echo   55 % LA 44mm; MR mild-mod              Date Cr Hgb  7/19 1.01 13.8       TSH 0.9   The patient denies chest pain, shortness of breath, nocturnal dyspnea, orthopnea or peripheral edema.  There have been no palpitations, lightheadedness or syncope.   Records and Results Reviewed  Past Medical History:  Diagnosis Date  . Typical atrial flutter [facility](HCC)     Past Surgical History:  Procedure Laterality Date  . A-FLUTTER ABLATION N/A 03/13/2018   Procedure: A-FLUTTER ABLATION;  Surgeon: Duke SalviaKlein, Steven C, MD;  [facility];  Service: Cardiovascular;  Laterality: N/A;  . NO PAST SURGERIES      Current Meds  Medication Sig  . fexofenadine (ALLEGRA) 30 MG tablet Take 30 mg by mouth daily as needed (Allergies).   Marland Kitchen. ibuprofen (ADVIL,MOTRIN) 200 MG tablet Take 600 mg by mouth every 8 (eight) hours as needed (pain).   . [DISCONTINUED] apixaban (ELIQUIS) 5 MG TABS tablet Take 1 tablet (5 mg total) by mouth 2 (two) times daily.    No Known Allergies    Review of Systems negative except from HPI and PMH  Physical Exam BP 122/84 (BP Location: Left Arm, Patient Position: Sitting, Cuff Size: Normal)   Pulse (!) 56   Ht 6\' 4"  (1.93 m)   Wt 207 lb 12 oz (94.2 kg)   BMI 25.29 kg/m  Well developed and well nourished in no acute distress HENT normal E scleral and icterus clear Neck Supple JVP flat; carotids brisk and full Clear to ausculation Regular rate and rhythm,soft S1 no murmurs gallops or rub Soft with active bowel sounds No clubbing cyanosis  Edema Alert and oriented, grossly normal motor and sensory function Skin Warm and Dry  Sinus rhythm at 56 Intervals 38/08/42 Blocked PAC giving rise to a pause  Assessment and   Plan  Atrial flutter typical  Sinus bradycardia  MR-moderate//LAE  First-degree AV block  The patient has had no recurrent flutter.  Symptoms have abated.  We will discontinue Eliquis.  We are left with a couple of questions.  One is was his mitral regurgitation primary or secondary associated with left atrial enlargement and annular dilatation.  We will repeat his echo  Second question is the cause of his first-degree AV block.  Is not related to a nonspecific diffuse cardiomyopathic process that is giving rise to atriopathy as manifested by flutter or possibly a more discrete process such as sarcoid.  We will look to see whether it resolves over time; otherwise would anticipate signal average ECG as well as cardiac MRI   Current medicines are reviewed at length with the patient today .  The patient does not have concerns regarding medicines.

## 2018-04-26 NOTE — Addendum Note (Signed)
Addended by: Festus AloeRESPO, Janyce Ellinger G on: 04/26/2018 11:48 AM   Modules accepted: Orders

## 2018-05-20 ENCOUNTER — Ambulatory Visit (INDEPENDENT_AMBULATORY_CARE_PROVIDER_SITE_OTHER): Payer: BLUE CROSS/BLUE SHIELD

## 2018-05-20 ENCOUNTER — Other Ambulatory Visit: Payer: Self-pay

## 2018-05-20 DIAGNOSIS — I483 Typical atrial flutter: Secondary | ICD-10-CM | POA: Diagnosis not present

## 2018-05-20 DIAGNOSIS — I517 Cardiomegaly: Secondary | ICD-10-CM | POA: Diagnosis not present

## 2018-05-20 DIAGNOSIS — I4892 Unspecified atrial flutter: Secondary | ICD-10-CM | POA: Diagnosis not present

## 2018-06-13 ENCOUNTER — Encounter

## 2018-06-13 ENCOUNTER — Ambulatory Visit: Payer: BLUE CROSS/BLUE SHIELD | Admitting: Internal Medicine

## 2018-06-20 ENCOUNTER — Ambulatory Visit: Payer: BLUE CROSS/BLUE SHIELD | Admitting: Internal Medicine

## 2018-06-20 ENCOUNTER — Encounter: Payer: Self-pay | Admitting: Internal Medicine

## 2018-06-20 VITALS — BP 120/88 | HR 50 | Ht 75.5 in | Wt 206.5 lb

## 2018-06-20 DIAGNOSIS — I440 Atrioventricular block, first degree: Secondary | ICD-10-CM | POA: Diagnosis not present

## 2018-06-20 DIAGNOSIS — I483 Typical atrial flutter: Secondary | ICD-10-CM | POA: Diagnosis not present

## 2018-06-20 DIAGNOSIS — R9431 Abnormal electrocardiogram [ECG] [EKG]: Secondary | ICD-10-CM

## 2018-06-20 NOTE — Patient Instructions (Signed)
Medication Instructions:  - Your physician recommends that you continue on your current medications as directed. Please refer to the Current Medication list given to you today.  If you need a refill on your cardiac medications before your next appointment, please call your pharmacy.   Lab work: - none ordered  If you have labs (blood work) drawn today and your tests are completely normal, you will receive your results only by: Marland Kitchen. MyChart Message (if you have MyChart) OR . A paper copy in the mail If you have any lab test that is abnormal or we need to change your treatment, we will call you to review the results.  Testing/Procedures: Your physician has requested that you have a cardiac MRI. Cardiac MRI uses a computer to create images of your heart as its beating, producing both still and moving pictures of your heart and major blood vessels. For further information please visit [email or web address].   You will be called by our [facility] to [facility]    Your physician has recommended that you have a Signal Averaged EKG- we will try to coordinate this to be done the same day of your Cardiac MRI. Herbert SetaHeather will call to you arrange once we know the date on your MRI   Follow-Up: At [facility], you and your health needs are our priority.  As part of our continuing mission to provide you with exceptional heart care, we have created designated Provider Care Teams.  These Care Teams include your primary Cardiologist (physician) and Advanced Practice Providers (APPs -  Physician Assistants and Nurse Practitioners) who all work together to provide you with the care you need, when you need it. . You will need a follow up appointment in 1 year with Dr. Graciela HusbandsKlein.  Please call our office 2 months in advance to schedule this appointment.   Any Other Special Instructions Will Be Listed Below (If Applicable). - N/A

## 2018-06-20 NOTE — Progress Notes (Signed)
      Patient Care Team: Maple Hudson., MD as PCP - General (Family Medicine)   HPI  Trevor Parker is a 48 y.o. male Seen following ablation of typical atrial flutter 10/19  DATE TEST EF   7/19 Echo   55 % LA 3mm; MR mild-mod  12/19  Echo 55% LA 35 MR mild        Date Cr Hgb  7/19 1.01 13.8       TSH 0.9  The patient denies chest pain, shortness of breath, nocturnal dyspnea, orthopnea or peripheral edema.  There have been no palpitations, lightheadedness or syncope.   Records and Results Reviewed  Past Medical History:  Diagnosis Date  . Typical atrial flutter [facility])     Past Surgical History:  Procedure Laterality Date  . A-FLUTTER ABLATION N/A 03/13/2018   Procedure: A-FLUTTER ABLATION;  Surgeon: Duke Salvia, MD;  Location: [facility] INVASIVE CV LAB;  Service: Cardiovascular;  Laterality: N/A;  . NO PAST SURGERIES      Current Meds  Medication Sig  . fexofenadine (ALLEGRA) 30 MG tablet Take 30 mg by mouth daily as needed (Allergies).   Marland Kitchen ibuprofen (ADVIL,MOTRIN) 200 MG tablet Take 600 mg by mouth every 8 (eight) hours as needed (pain).   . Multiple Vitamin (MULTIVITAMIN) capsule Take 1 capsule by mouth daily.    No Known Allergies    Review of Systems negative except from HPI and PMH  Physical Exam BP 120/88 (BP Location: Left Arm, Patient Position: Sitting, Cuff Size: Normal)   Pulse (!) 50   Ht 6' 3.5" (1.918 m)   Wt 206 lb 8 oz (93.7 kg)   BMI 25.47 kg/m   Well developed and nourished in no acute distress HENT normal Neck supple with JVP-flat Clear Slow and irregular rate and rhythm, no murmurs or gallops Abd-soft with active BS No Clubbing cyanosis edema Skin-warm and dry A & Oriented  Grossly normal sensory and motor function   ECG demonstrates sinus rhythm at 50 with first-degree AV block and now second-degree AV block type I with some blocked PACs  Assessment and  Plan  Atrial flutter typical-status post  ablation  Sinus bradycardia  MR-moderate//LAE  First-degree AV block second-degree AV block type I   Patient mitral regurgitation and atrial dimensions appear less.  This would suggest that the MR was indeed secondary to the atriopathy.  However, we are left with the conduction system disease which may be related to fitness but with the atriopathy invites consideration of an underlying cardiomyopathic process.  We will undertake a cMRI  We spent more than 50% of our >25 min visit in face to face counseling regarding the above  Current medicines are reviewed at length with the patient today .  The patient does not have concerns regarding medicines.

## 2018-06-25 ENCOUNTER — Encounter: Payer: Self-pay | Admitting: *Deleted

## 2018-06-25 ENCOUNTER — Telehealth: Payer: Self-pay | Admitting: *Deleted

## 2018-06-25 NOTE — Telephone Encounter (Signed)
Unable to reach patient by phone--will mail information regarding Cardiac MRI

## 2018-06-27 ENCOUNTER — Telehealth: Payer: Self-pay | Admitting: Internal Medicine

## 2018-06-27 NOTE — Telephone Encounter (Signed)
I left a message on the patient's identified voice mail that his signal averaged EKG has been scheduled for wed 07/10/2018 at [facility] at 10:00 am. He will need to arrive in Admitting at 9:45 am the morning of the test- no special instructions.  He will have his Cardiac MRI at 11:00 am the same day.   I asked that he call back with any further questions/ concerns.

## 2018-07-10 ENCOUNTER — Ambulatory Visit (HOSPITAL_COMMUNITY)
Admission: RE | Admit: 2018-07-10 | Discharge: 2018-07-10 | Disposition: A | Discharge status: Home / Self Care | Payer: BLUE CROSS/BLUE SHIELD | Source: Ambulatory Visit | Attending: Internal Medicine | Admitting: Internal Medicine

## 2018-07-10 ENCOUNTER — Other Ambulatory Visit: Payer: Self-pay

## 2018-07-10 DIAGNOSIS — I440 Atrioventricular block, first degree: Secondary | ICD-10-CM | POA: Insufficient documentation

## 2018-07-10 DIAGNOSIS — R9431 Abnormal electrocardiogram [ECG] [EKG]: Secondary | ICD-10-CM | POA: Diagnosis present

## 2018-07-10 MED ORDER — GADOBUTROL 1 MMOL/ML IV SOLN
10.0000 mL | Freq: Once | INTRAVENOUS | Status: AC | PRN
  Administered 2018-07-10: 10 mL via INTRAVENOUS

## 2018-07-23 ENCOUNTER — Telehealth: Payer: Self-pay | Admitting: Internal Medicine

## 2018-07-23 NOTE — Telephone Encounter (Signed)
Cardiac MRI in Dr. Odessa Fleming in-basket to review.

## 2018-07-23 NOTE — Telephone Encounter (Signed)
Pt states he had an MRI and would like the results. Please call to discuss. Pt authorizes to leave a detailed message.

## 2018-07-23 NOTE — Telephone Encounter (Signed)
I spoke with the patient regarding his Cardiac MRI results. He is aware that his Signal Averaged EKG has not been resulted by Dr. Graciela Husbands. He is aware I will follow up on this with Dr. Graciela Husbands and call him back. The patient voices understanding and is agreeable.

## 2018-07-30 NOTE — Telephone Encounter (Signed)
To Dr. Graciela Husbands to review signal averaged EKG.

## 2018-07-30 NOTE — Telephone Encounter (Signed)
Patient calling to follow up on status  States once reviewed with Dr Graciela Husbands it is ok to leave a detailed message, he will be teaching

## 2018-08-01 NOTE — Telephone Encounter (Signed)
Dr. Graciela Husbands reviewed Signal Averaged EKG readings today. He states there was some slight abnormality there. He will call the patient.

## 2018-08-06 NOTE — Telephone Encounter (Signed)
Noted- appt recall changed in the system. I will call the patient prior to his follow up appt in August to schedule a repeat signal averaged EKG.

## 2018-08-06 NOTE — Telephone Encounter (Signed)
Spoke with patient    No symptoms  SAECG borderline abnormal  Will repeat with OV in 6 mo

## 2018-12-26 ENCOUNTER — Telehealth: Payer: Self-pay | Admitting: *Deleted

## 2018-12-26 DIAGNOSIS — I517 Cardiomegaly: Secondary | ICD-10-CM

## 2018-12-26 DIAGNOSIS — R9431 Abnormal electrocardiogram [ECG] [EKG]: Secondary | ICD-10-CM

## 2018-12-26 NOTE — Telephone Encounter (Signed)
Attempted to call the patient. No answer- I left a message for the patient to please call back.  

## 2018-12-26 NOTE — Telephone Encounter (Signed)
I spoke with the EKG dept at [facility] [phone]. The patient has been scheduled for his signal averaged EKG on 01/21/19 at 1:00 pm at [facility]. Per EKG dept, he will need to arrive on the [facility] side of the hospital. He will need to use the [employer] parking service and enter through Entrance "C," which will take him to the [place].   I called the patient back and left a detailed message of the above. Advised that he arrive at 12:45 pm.  I asked that he call back with any questions/ concerns.

## 2018-12-26 NOTE — Telephone Encounter (Signed)
-----   Message from Emily Filbert, RN sent at 08/06/2018  3:43 PM EST ----- Patient needs a signal averaged EKG in early August- just prior to follow up with Dr. Caryl Comes.

## 2018-12-26 NOTE — Telephone Encounter (Signed)
The patient called back. He is aware that I need to r/s his EKG from [facility] to [facility] as it for the signal averaged EKG and this is only done at [facility].   The patient voices understanding and is agreeable.  He would like this on a Tues/ Thurs early afternoon.

## 2019-01-21 ENCOUNTER — Ambulatory Visit (HOSPITAL_COMMUNITY)
Admission: RE | Admit: 2019-01-21 | Discharge: 2019-01-21 | Disposition: A | Discharge status: Home / Self Care | Payer: BLUE CROSS/BLUE SHIELD | Source: Ambulatory Visit | Attending: Internal Medicine | Admitting: Internal Medicine

## 2019-01-21 ENCOUNTER — Other Ambulatory Visit: Payer: Self-pay

## 2019-01-21 ENCOUNTER — Ambulatory Visit: Payer: BLUE CROSS/BLUE SHIELD

## 2019-01-21 DIAGNOSIS — I517 Cardiomegaly: Secondary | ICD-10-CM | POA: Diagnosis not present

## 2019-01-21 DIAGNOSIS — R9431 Abnormal electrocardiogram [ECG] [EKG]: Secondary | ICD-10-CM | POA: Insufficient documentation

## 2019-02-04 ENCOUNTER — Encounter: Payer: Self-pay | Admitting: Internal Medicine

## 2019-02-04 ENCOUNTER — Ambulatory Visit (INDEPENDENT_AMBULATORY_CARE_PROVIDER_SITE_OTHER): Payer: BLUE CROSS/BLUE SHIELD | Admitting: Internal Medicine

## 2019-02-04 ENCOUNTER — Other Ambulatory Visit: Payer: Self-pay

## 2019-02-04 ENCOUNTER — Telehealth: Payer: Self-pay | Admitting: Internal Medicine

## 2019-02-04 VITALS — BP 140/66 | HR 42 | Ht 76.0 in | Wt 210.0 lb

## 2019-02-04 DIAGNOSIS — I421 Obstructive hypertrophic cardiomyopathy: Secondary | ICD-10-CM

## 2019-02-04 DIAGNOSIS — I483 Typical atrial flutter: Secondary | ICD-10-CM

## 2019-02-04 DIAGNOSIS — I517 Cardiomegaly: Secondary | ICD-10-CM | POA: Diagnosis not present

## 2019-02-04 NOTE — Telephone Encounter (Signed)
To Dr. Klein to review. 

## 2019-02-04 NOTE — Telephone Encounter (Signed)
Patient wanted to ask Dr. Caryl Comes about a plant based diet for patient.  Patient also has seen Dr. Clayborn Bigness in between previous [place] visits and he mentioned a pacemaker possibly in the future. Patient would like to make doctor aware and get any recommendations

## 2019-02-04 NOTE — Progress Notes (Signed)
      Patient Care Team: Jerrol Banana., MD as PCP - General (Family Medicine)   HPI  Trevor Parker is a 48 y.o. male Seen following ablation of typical atrial flutter 10/19  Also with conduction system disease undertook eval to look for underlying cardiomyopathy to explain atriopathy and conduction system disease  DATE TEST EF   7/19 Echo   55 % LA 2mm; MR mild-mod  12/19  Echo 55% LA 35 MR mild  2/20 cMRI 60% Normal    Date Cr Hgb  7/19 1.01 13.8         Date fQRS Dur LAS RMS 40  2/20 119 41 20  8/20 140 42 7   The patient denies chest pain, shortness of breath, nocturnal dyspnea, orthopnea or peripheral edema.  There have been no palpitations, lightheadedness or syncope.      Records and Results Reviewed  Past Medical History:  Diagnosis Date  . Typical atrial flutter [facility])     Past Surgical History:  Procedure Laterality Date  . A-FLUTTER ABLATION N/A 03/13/2018   Procedure: A-FLUTTER ABLATION;  Surgeon: Deboraha Sprang, MD;  Location: [place] CV LAB;  Service: Cardiovascular;  Laterality: N/A;  . NO PAST SURGERIES      Current Meds  Medication Sig  . fexofenadine (ALLEGRA) 30 MG tablet Take 30 mg by mouth daily as needed (Allergies).   Marland Kitchen ibuprofen (ADVIL,MOTRIN) 200 MG tablet Take 600 mg by mouth every 8 (eight) hours as needed (pain).   . Multiple Vitamin (MULTIVITAMIN) capsule Take 1 capsule by mouth daily.    No Known Allergies    Review of Systems negative except from HPI and PMH  Physical Exam BP 140/66 (BP Location: Left Arm, Patient Position: Sitting, Cuff Size: Normal)   Pulse (!) 42   Ht 6\' 4"  (1.93 m)   Wt 210 lb (95.3 kg)   SpO2 98%   BMI 25.56 kg/m   Well developed and nourished in no acute distress HENT normal Neck supple with JVP-  Flat  Clear Regular rate and rhythm, no murmurs or gallops Abd-soft with active BS No Clubbing cyanosis edema Skin-warm and dry A & Oriented  Grossly normal sensory and motor  function  ECG  Sinus @ 60 MBZ 1 physiology noted with PR prolonging 200>>600 msec with consecutive beats And PACs blocked noted in pattern of trigeminy  Assessment and  Plan  Atrial flutter typical-status post ablation  MR-moderate//LAE  First-degree AV block second-degree AV block type I  SAECG--abnormal     Will continue to monitor  I spent a long time trying to convince myself that the new number of fQRS was NOT real-- it seems that it is close  Will plan to repeat with repeat cMRI in12/20 Reviewed with patient and no evidence of cardiomyopathy to preclude exercise   We spent more than 50% of our >25 min visit in face to face counseling regarding the above

## 2019-02-04 NOTE — Patient Instructions (Signed)
Medication Instructions:  Your physician recommends that you continue on your current medications as directed. Please refer to the Current Medication list given to you today.  If you need a refill on your cardiac medications before your next appointment, please call your pharmacy.   Lab work: Your physician recommends that you return for lab work in about 3 months prior to having the Cardiac MRI.  - It needs to be within 30 days of the MRI. - Please go to the [facility]. You will check in at the front desk to the right as you walk into the atrium. Valet Parking is offered if needed.    If you have labs (blood work) drawn today and your tests are completely normal, you will receive your results only by: Marland Kitchen MyChart Message (if you have MyChart) OR . A paper copy in the mail If you have any lab test that is abnormal or we need to change your treatment, we will call you to review the results.  Testing/Procedures: Your physician has requested that you have a cardiac MRI in December at [facility]. Cardiac MRI uses a computer to create images of your heart as its beating, producing both still and moving pictures of your heart and major blood vessels. For further information please visit [email or web address] Please follow the instruction sheet given to you today for more information.   Follow-Up: At [facility], you and your health needs are our priority.  As part of our continuing mission to provide you with exceptional heart care, we have created designated Provider Care Teams.  These Care Teams include your primary Cardiologist (physician) and Advanced Practice Providers (APPs -  Physician Assistants and Nurse Practitioners) who all work together to provide you with the care you need, when you need it. You will need a follow up appointment in 3 months after Cardiac MRI. You may see Dr Deboraha Sprang.     Magnetic Resonance Imaging Magnetic resonance imaging (MRI) is a  painless test that takes pictures of the inside of your body. This test uses a strong magnet. This test does not use X-rays. What happens before the procedure?  You will be asked about any metal you may have in your body. This includes: ? Any joint replacement (prosthesis), such as an artificial knee or hip. ? Any implanted devices or ports. ? A metallic ear implant (cochlear implant). ? An artificial heart valve. ? A metallic object in the eye. ? Metal splinters. ? Bullet fragments. ? Any tattoos. Some of the darker inks can cause problems with testing.  You will be asked to take off all metal. This includes: ? Your watch, jewelry, and other metal objects. ? Hearing aids. ? Dentures. ? Underwire bra. ? Makeup. Some kinds of makeup contain small amounts of metal. ? Braces and fillings normally are not a problem.  If you are breastfeeding, ask your doctor if you need to pump before your test and then stop breastfeeding for a short time. You may need to do this if dye (contrast material) will be used during your MRI.  If you have a fear of cramped spaces (claustrophobia), you may be given medicines prior to the MRI. What happens during the procedure?   You may be given earplugs or headphones to listen to music. The MRI machine can be noisy.  You will lie down on a platform. It looks like a long table.  If dye will be used, an IV tube will be placed into  one of your veins. Dye will be given through your IV tube at a certain time as images are taken.  The platform will slide into a tunnel. The tunnel has magnets inside of it. When you are inside the tunnel, you will still be able to talk to your doctor.  The tunnel will scan your body and make images. You will be asked to lie very still. Your doctor will tell you when you can move. You may have to wait a few minutes to make sure that the images from the test are clear.  When all images are taken, the platform will slide out of the  tunnel. The procedure may vary among doctors and hospitals. What happens after the procedure?  You may be taken to a recovery area if sedation medicines were used. Your blood pressure, heart rate, breathing rate, and blood oxygen level will be monitored until you leave the hospital or clinic.  If dye was used: ? It will leave your body through your pee (urine). It takes about a day for all of the dye to leave the body. ? You may be told to drink plenty of fluids. This helps your body get rid of the dye. ? If you are breastfeeding, do not breastfeed your child until your doctor says that this is safe.  You may go back to your normal activities right away, or as told by your doctor.  It is up to you to get your test results. Ask your doctor, or the department that is doing the test, when your results will be ready. Summary  Magnetic resonance imaging (MRI) is a test that takes pictures of the inside of your body.  Before your MRI, be sure to tell your doctor about any metal you may have in your body.  You may go back to your normal activities right away, or as told by your doctor. This information is not intended to replace advice given to you by your health care provider. Make sure you discuss any questions you have with your health care provider. Document Released: 06/24/2010 Document Revised: 04/16/2017 Document Reviewed: 04/16/2017 Elsevier Patient Education  2020 [employer]

## 2019-02-06 NOTE — Telephone Encounter (Signed)
I called and spoke with the patient and advised him of Dr. Olin Pia recommendations/ response to his questions. The patient voiced understanding and is agreeable.

## 2019-02-06 NOTE — Telephone Encounter (Signed)
Heather plz let him know that a pacemaker may well be in the future but not at this point as he has no attributable symptoms Good luck with plants  Thanks SK

## 2019-03-11 ENCOUNTER — Telehealth: Payer: Self-pay | Admitting: *Deleted

## 2019-03-11 ENCOUNTER — Encounter: Payer: Self-pay | Admitting: *Deleted

## 2019-03-11 NOTE — Telephone Encounter (Signed)
Left message regarding appointment for cardiac MRI scheduled for 05/12/19 at 11:00 am at [facility] at 10:15 am 1st floor admissions office ---information is in My Chart and I will also mail a letter.

## 2019-05-06 ENCOUNTER — Telehealth: Payer: Self-pay | Admitting: Internal Medicine

## 2019-05-06 NOTE — Telephone Encounter (Signed)
The patient has a Cardiac MRI scheduled for 12/7 and follow up with Dr. Caryl Comes on 12/8. I attempted to call the patient to r/s his appt on 12/8 to 12/11 with Dr. Caryl Comes since his report will not be ready by 12/8 most likely. No answer- I left a message to please call back to r/s.

## 2019-05-08 NOTE — Telephone Encounter (Signed)
Reviewed the patient's chart prior to calling him back- his appt has been r/s to 12/11 with Dr. Caryl Comes.

## 2019-05-09 ENCOUNTER — Telehealth (HOSPITAL_COMMUNITY): Payer: Self-pay | Admitting: Emergency Medicine

## 2019-05-09 NOTE — Telephone Encounter (Signed)
Reaching out to patient to offer assistance regarding upcoming cardiac imaging study; pt verbalizes understanding of appt date/time, parking situation and where to check in, and verified current allergies; name and call back number provided for further questions should they arise Bishoy Cupp RN Navigator Cardiac Imaging [facility] [phone] office [phone] cell 

## 2019-05-12 ENCOUNTER — Ambulatory Visit (HOSPITAL_COMMUNITY)
Admission: RE | Admit: 2019-05-12 | Discharge: 2019-05-12 | Disposition: A | Discharge status: Home / Self Care | Payer: BLUE CROSS/BLUE SHIELD | Source: Ambulatory Visit | Attending: Internal Medicine | Admitting: Internal Medicine

## 2019-05-12 ENCOUNTER — Other Ambulatory Visit: Payer: Self-pay

## 2019-05-12 DIAGNOSIS — I517 Cardiomegaly: Secondary | ICD-10-CM

## 2019-05-12 DIAGNOSIS — I421 Obstructive hypertrophic cardiomyopathy: Secondary | ICD-10-CM | POA: Insufficient documentation

## 2019-05-12 DIAGNOSIS — I483 Typical atrial flutter: Secondary | ICD-10-CM

## 2019-05-12 MED ORDER — GADOBUTROL 1 MMOL/ML IV SOLN
10.0000 mL | Freq: Once | INTRAVENOUS | Status: AC | PRN
  Administered 2019-05-12: 10 mL via INTRAVENOUS

## 2019-05-13 ENCOUNTER — Ambulatory Visit: Payer: BLUE CROSS/BLUE SHIELD | Admitting: Internal Medicine

## 2019-05-16 ENCOUNTER — Ambulatory Visit (INDEPENDENT_AMBULATORY_CARE_PROVIDER_SITE_OTHER): Payer: BLUE CROSS/BLUE SHIELD | Admitting: Internal Medicine

## 2019-05-16 ENCOUNTER — Other Ambulatory Visit: Payer: Self-pay

## 2019-05-16 ENCOUNTER — Encounter: Payer: Self-pay | Admitting: Internal Medicine

## 2019-05-16 VITALS — BP 138/70 | HR 45 | Ht 76.0 in | Wt 215.0 lb

## 2019-05-16 DIAGNOSIS — I340 Nonrheumatic mitral (valve) insufficiency: Secondary | ICD-10-CM | POA: Diagnosis not present

## 2019-05-16 DIAGNOSIS — I440 Atrioventricular block, first degree: Secondary | ICD-10-CM

## 2019-05-16 DIAGNOSIS — R001 Bradycardia, unspecified: Secondary | ICD-10-CM | POA: Diagnosis not present

## 2019-05-16 DIAGNOSIS — I483 Typical atrial flutter: Secondary | ICD-10-CM | POA: Diagnosis not present

## 2019-05-16 NOTE — Progress Notes (Signed)
      Patient Care Team: Jerrol Banana., MD as PCP - General (Family Medicine)   HPI  Trevor Parker is a 48 y.o. male Seen following ablation of typical atrial flutter 10/19  Also with conduction system disease undertook eval to look for underlying cardiomyopathy to explain atriopathy and conduction system disease  DATE TEST EF   7/19 Echo   55 % LA 49mm; MR mild-mod  12/19  Echo 55% LA 35 MR mild  2/20 cMRI 60% Normal   12/20 cMRI 60% Normal   Date Cr Hgb  7/19 1.01 13.8         Date fQRS Dur LAS RMS 40  2/20 119 41 20  8/20 140 42 7   The patient denies chest pain, shortness of breath, nocturnal dyspnea, orthopnea or peripheral edema.  There have been no palpitations, lightheadedness or syncope.     Records and Results Reviewed  Past Medical History:  Diagnosis Date  . Typical atrial flutter [facility])     Past Surgical History:  Procedure Laterality Date  . A-FLUTTER ABLATION N/A 03/13/2018   Procedure: A-FLUTTER ABLATION;  Surgeon: Deboraha Sprang, MD;  Location: [place] CV LAB;  Service: Cardiovascular;  Laterality: N/A;  . NO PAST SURGERIES      Current Meds  Medication Sig  . fexofenadine (ALLEGRA) 30 MG tablet Take 30 mg by mouth daily as needed (Allergies).   Marland Kitchen ibuprofen (ADVIL,MOTRIN) 200 MG tablet Take 600 mg by mouth every 8 (eight) hours as needed (pain).   . Multiple Vitamin (MULTIVITAMIN) capsule Take 1 capsule by mouth daily.    No Known Allergies    Review of Systems negative except from HPI and PMH  Physical Exam BP 138/70 (BP Location: Left Arm, Patient Position: Sitting, Cuff Size: Normal)   Pulse (!) 45   Ht 6\' 4"  (1.93 m)   Wt 215 lb (97.5 kg)   SpO2 95%   BMI 26.17 kg/m    Well developed and nourished in no acute distress HENT normal Neck supple Clear Regular rate and rhythm, no murmurs or gallops Abd-soft with active BS No Clubbing cyanosis edema Skin-warm and dry A & Oriented  Grossly normal sensory and  motor function  ECG sinus  @ 45 -/08/41   Assessment and  Plan  Atrial flutter typical-status post ablation  MR-moderate//LAE  First-degree AV block second-degree AV block type I  SAECG--abnormal     MRI is normal  Will plan to see him in one year with SAECG

## 2019-06-12 ENCOUNTER — Encounter: Payer: Self-pay | Admitting: *Deleted

## 2019-12-12 NOTE — Progress Notes (Signed)
Established patient visit   Patient: Trevor Parker   DOB: 04-Oct-1970   49 y.o. Male  MRN: [id number] Visit Date: 12/16/2019  I,Quintan Saldivar S Vanissa Strength,acting as a scribe for Megan Mans, MD.,have documented all relevant documentation on the behalf of Megan Mans, MD,as directed by  Megan Mans, MD while in the presence of Megan Mans, MD.  Today's healthcare provider: Megan Mans, MD   Chief Complaint  Patient presents with  . Foot Problem   Subjective    HPI  Patient here today C/O tingling in feet and hands x several months. Patient reports symptoms are more noticeable when he is standing for a long period of time. Patient reports more noticeable on feet.  No other symptoms.  Patient Active Problem List   Diagnosis Date Noted  . Typical atrial flutter (HCC) 03/13/2018  . Big thyroid 01/05/2016  . Left ventricular hypertrophy 01/05/2016  . Allergic rhinitis 09/17/2006   Social History   Tobacco Use  . Smoking status: Never Smoker  . Smokeless tobacco: Never Used  Vaping Use  . Vaping Use: Never used  Substance Use Topics  . Alcohol use: No  . Drug use: No       Medications: Outpatient Medications Prior to Visit  Medication Sig  . fexofenadine (ALLEGRA) 30 MG tablet Take 30 mg by mouth daily as needed (Allergies).   Marland Kitchen ibuprofen (ADVIL,MOTRIN) 200 MG tablet Take 600 mg by mouth every 8 (eight) hours as needed (pain).   . Multiple Vitamin (MULTIVITAMIN) capsule Take 1 capsule by mouth daily.   No facility-administered medications prior to visit.    Review of Systems  Constitutional: Negative for activity change, chills and fever.  Respiratory: Negative for chest tightness and shortness of breath.   Cardiovascular: Negative for chest pain and palpitations.  Allergic/Immunologic: Negative.   Neurological: Positive for numbness.  Hematological: Negative.   Psychiatric/Behavioral: Negative.        Objective    BP 112/66 (BP  Location: Left Arm, Patient Position: Sitting, Cuff Size: Normal)   Pulse 60   Temp (!) 97.4 F (36.3 C) (Temporal)   Resp 16   Ht 6\' 4"  (1.93 m)   Wt 214 lb (97.1 kg)   BMI 26.05 kg/m     Physical Exam Constitutional:      Appearance: Normal appearance.  Eyes:     General: No scleral icterus.    Conjunctiva/sclera: Conjunctivae normal.  Neck:     Comments: Mild thyroid enlargement without any noticeable nodules. Cardiovascular:     Rate and Rhythm: Normal rate and regular rhythm.     Pulses: Normal pulses.     Heart sounds: Normal heart sounds.  Pulmonary:     Effort: Pulmonary effort is normal.     Breath sounds: Normal breath sounds.  Musculoskeletal:     Cervical back: No tenderness.     Right lower leg: No edema.     Left lower leg: No edema.  Skin:    General: Skin is warm and dry.  Neurological:     General: No focal deficit present.     Mental Status: He is alert and oriented to person, place, and time.  Psychiatric:        Mood and Affect: Mood normal.        Behavior: Behavior normal.        Thought Content: Thought content normal.        Judgment: Judgment normal.  No results found for any visits on 12/16/19.  Assessment & Plan     1. Tingling Exam today. - TSH  2. Neuropathy Trial of alpha lipoic acid have offered neurology referral if work-up is negative and this persists or worsens - CBC with Differential/Platelet - Hemoglobin A1c - TSH  3. Big thyroid No nodules and he has had a negative work-up in the past. - Vitamin B12  4. Typical atrial flutter (HCC) Followed by cardiology.   Return in about 3 months (around 03/17/2020) for CPE.      I, Megan Mans, MD, have reviewed all documentation for this visit. The documentation on 12/19/19 for the exam, diagnosis, procedures, and orders are all accurate and complete.    Richard Wendelyn Breslow, MD  [facility] [phone] (phone) [phone] (fax)  [facility]

## 2019-12-16 ENCOUNTER — Other Ambulatory Visit: Payer: Self-pay

## 2019-12-16 ENCOUNTER — Ambulatory Visit: Payer: BLUE CROSS/BLUE SHIELD | Admitting: Family Medicine

## 2019-12-16 ENCOUNTER — Encounter: Payer: Self-pay | Admitting: Family Medicine

## 2019-12-16 VITALS — BP 112/66 | HR 60 | Temp 97.4°F | Resp 16 | Ht 76.0 in | Wt 214.0 lb

## 2019-12-16 DIAGNOSIS — I483 Typical atrial flutter: Secondary | ICD-10-CM

## 2019-12-16 DIAGNOSIS — G629 Polyneuropathy, unspecified: Secondary | ICD-10-CM

## 2019-12-16 DIAGNOSIS — E010 Iodine-deficiency related diffuse (endemic) goiter: Secondary | ICD-10-CM

## 2019-12-16 DIAGNOSIS — R202 Paresthesia of skin: Secondary | ICD-10-CM

## 2019-12-16 NOTE — Patient Instructions (Signed)
Try over the counter Alpha Lipoic Acid take one daily.

## 2019-12-17 LAB — CBC WITH DIFFERENTIAL/PLATELET
Basophils Absolute: 0.1 10*3/uL (ref 0.0–0.2)
Basos: 1 %
EOS (ABSOLUTE): 0.1 10*3/uL (ref 0.0–0.4)
Eos: 2 %
Hematocrit: 41.7 % (ref 37.5–51.0)
Hemoglobin: 13.9 g/dL (ref 13.0–17.7)
Immature Grans (Abs): 0 10*3/uL (ref 0.0–0.1)
Immature Granulocytes: 0 %
Lymphocytes Absolute: 1.3 10*3/uL (ref 0.7–3.1)
Lymphs: 24 %
MCH: 29.6 pg (ref 26.6–33.0)
MCHC: 33.3 g/dL (ref 31.5–35.7)
MCV: 89 fL (ref 79–97)
Monocytes Absolute: 0.4 10*3/uL (ref 0.1–0.9)
Monocytes: 7 %
Neutrophils Absolute: 3.5 10*3/uL (ref 1.4–7.0)
Neutrophils: 66 %
Platelets: 195 10*3/uL (ref 150–450)
RBC: 4.69 x10E6/uL (ref 4.14–5.80)
RDW: 13.7 % (ref 11.6–15.4)
WBC: 5.3 10*3/uL (ref 3.4–10.8)

## 2019-12-17 LAB — HEMOGLOBIN A1C
Est. average glucose Bld gHb Est-mCnc: 117 mg/dL
Hgb A1c MFr Bld: 5.7 % — ABNORMAL HIGH (ref 4.8–5.6)

## 2019-12-17 LAB — TSH: TSH: 0.947 u[IU]/mL (ref 0.450–4.500)

## 2019-12-17 LAB — VITAMIN B12: Vitamin B-12: 533 pg/mL (ref 232–1245)

## 2019-12-19 ENCOUNTER — Telehealth: Payer: Self-pay

## 2019-12-19 NOTE — Telephone Encounter (Signed)
-----   Message from Maple Hudson., MD sent at 12/19/2019  1:35 PM EDT ----- Labs normal.

## 2019-12-19 NOTE — Telephone Encounter (Signed)
Called to advise patient of labs. No answer, LVMTCB.

## 2019-12-19 NOTE — Telephone Encounter (Signed)
Patient advised of lab results. He states he had a chance to view the results on line through mychart and noticed that his A1C was out of the normal range. His result was 5.7. Patient wants to know could his elevated A1C be related or be the cause of his neuropathy. Please advise.

## 2020-03-18 ENCOUNTER — Other Ambulatory Visit: Payer: Self-pay

## 2020-03-18 ENCOUNTER — Ambulatory Visit (INDEPENDENT_AMBULATORY_CARE_PROVIDER_SITE_OTHER): Payer: 59 | Admitting: Family Medicine

## 2020-03-18 VITALS — BP 98/64 | HR 69 | Ht 76.0 in | Wt 206.2 lb

## 2020-03-18 DIAGNOSIS — G629 Polyneuropathy, unspecified: Secondary | ICD-10-CM | POA: Diagnosis not present

## 2020-03-18 DIAGNOSIS — Z1211 Encounter for screening for malignant neoplasm of colon: Secondary | ICD-10-CM

## 2020-03-18 DIAGNOSIS — Z125 Encounter for screening for malignant neoplasm of prostate: Secondary | ICD-10-CM | POA: Diagnosis not present

## 2020-03-18 DIAGNOSIS — Z1389 Encounter for screening for other disorder: Secondary | ICD-10-CM | POA: Diagnosis not present

## 2020-03-18 DIAGNOSIS — Z0000 Encounter for general adult medical examination without abnormal findings: Secondary | ICD-10-CM | POA: Diagnosis not present

## 2020-03-18 NOTE — Progress Notes (Signed)
I,April Miller,acting as a scribe for Megan Mans, MD.,have documented all relevant documentation on the behalf of Megan Mans, MD,as directed by  Megan Mans, MD while in the presence of Megan Mans, MD.   Complete physical exam   Patient: Trevor Parker   DOB: 15-Jan-1971   49 y.o. Male  MRN: [id number] Visit Date: 03/18/2020  Today's healthcare provider: Megan Mans, MD   Chief Complaint  Patient presents with   Annual Exam   Subjective    Galo Sayed is a 49 y.o. male who presents today for a complete physical exam.  He reports consuming a general diet. Home exercise routine includes walking. He generally feels well. He reports sleeping well. He does not have additional problems to discuss today.  HPI    Past Medical History:  Diagnosis Date   Typical atrial flutter [facility])    Past Surgical History:  Procedure Laterality Date   A-FLUTTER ABLATION N/A 03/13/2018   Procedure: A-FLUTTER ABLATION;  Surgeon: Duke Salvia, MD;  Location: [facility] INVASIVE CV LAB;  Service: Cardiovascular;  Laterality: N/A;   NO PAST SURGERIES     Social History   Socioeconomic History   Marital status: Single    Spouse name: Not on file   Number of children: Not on file   Years of education: Not on file   Highest education level: Not on file  Occupational History   Not on file  Tobacco Use   Smoking status: Never Smoker   Smokeless tobacco: Never Used  Vaping Use   Vaping Use: Never used  Substance and Sexual Activity   Alcohol use: No   Drug use: No   Sexual activity: Not Currently  Other Topics Concern   Not on file  Social History Narrative   Not on file   Social Determinants of Health   Financial Resource Strain:    Difficulty of Paying Living Expenses: Not on file  Food Insecurity:    Worried About Running Out of Food in the Last Year: Not on file   Ran Out of Food in the Last Year: Not on file  Transportation  Needs:    Lack of Transportation (Medical): Not on file   Lack of Transportation (Non-Medical): Not on file  Physical Activity:    Days of Exercise per Week: Not on file   Minutes of Exercise per Session: Not on file  Stress:    Feeling of Stress : Not on file  Social Connections:    Frequency of Communication with Friends and Family: Not on file   Frequency of Social Gatherings with Friends and Family: Not on file   Attends Religious Services: Not on file   Active Member of Clubs or Organizations: Not on file   Attends Banker Meetings: Not on file   Marital Status: Not on file  Intimate Partner Violence:    Fear of Current or Ex-Partner: Not on file   Emotionally Abused: Not on file   Physically Abused: Not on file   Sexually Abused: Not on file   Family Status  Relation Name Status   Mother  Alive   Father  Alive   Brother  Deceased       due to suicide   Family History  Problem Relation Age of Onset   Breast cancer Father    Suicidality Brother    No Known Allergies  Patient Care Team: Maple Hudson., MD as PCP - General (Family  Medicine)   Medications: Outpatient Medications Prior to Visit  Medication Sig   ALPHA LIPOIC ACID PO Take 1 tablet by mouth daily.   fexofenadine (ALLEGRA) 30 MG tablet Take 30 mg by mouth daily as needed (Allergies).    ibuprofen (ADVIL,MOTRIN) 200 MG tablet Take 600 mg by mouth every 8 (eight) hours as needed (pain).    Multiple Vitamin (MULTIVITAMIN) capsule Take 1 capsule by mouth daily.   No facility-administered medications prior to visit.    Review of Systems  All other systems reviewed and are negative.      Objective    BP 98/64    Pulse 69    Ht 6\' 4"  (1.93 m)    Wt 206 lb 3.2 oz (93.5 kg)    SpO2 98%    BMI 25.10 kg/m    Physical Exam Vitals reviewed.  Constitutional:      Appearance: He is well-developed.  HENT:     Head: Normocephalic and atraumatic.     Right Ear:  External ear normal.     Left Ear: External ear normal.     Nose: Nose normal.     Mouth/Throat:     Mouth: Mucous membranes are moist.     Pharynx: Oropharynx is clear.  Eyes:     General: No scleral icterus.    Conjunctiva/sclera: Conjunctivae normal.     Pupils: Pupils are equal, round, and reactive to light.  Cardiovascular:     Rate and Rhythm: Normal rate and regular rhythm.     Heart sounds: Normal heart sounds. No murmur heard.  No gallop.   Pulmonary:     Effort: Pulmonary effort is normal. No respiratory distress.     Breath sounds: Normal breath sounds. No wheezing.  Abdominal:     General: There is no distension.     Palpations: Abdomen is soft.  Genitourinary:    Penis: Normal.      Testes: Normal.  Musculoskeletal:        General: No tenderness.     Cervical back: Normal range of motion and neck supple.  Skin:    Findings: No erythema or rash.  Neurological:     General: No focal deficit present.     Mental Status: He is alert and oriented to person, place, and time.  Psychiatric:        Mood and Affect: Mood normal.        Behavior: Behavior normal.        Thought Content: Thought content normal.        Judgment: Judgment normal.       Last depression screening scores PHQ 2/9 Scores 03/18/2020 12/16/2019 01/23/2017  PHQ - 2 Score 0 0 0  PHQ- 9 Score 0 0 0   Last fall risk screening Fall Risk  12/16/2019  Falls in the past year? 0  Number falls in past yr: 0  Injury with Fall? 0  Risk for fall due to : No Fall Risks  Follow up Falls evaluation completed   Last Audit-C alcohol use screening Alcohol Use Disorder Test (AUDIT) 03/18/2020  1. How often do you have a drink containing alcohol? 0  2. How many drinks containing alcohol do you have on a typical day when you are drinking? 0  3. How often do you have six or more drinks on one occasion? 0  AUDIT-C Score 0  Alcohol Brief Interventions/Follow-up AUDIT Score <7 follow-up not indicated   A score  of 3 or more in  women, and 4 or more in men indicates increased risk for alcohol abuse, EXCEPT if all of the points are from question 1   No results found for any visits on 03/18/20.  Assessment & Plan    Routine Health Maintenance and Physical Exam  Exercise Activities and Dietary recommendations Goals   None     Immunization History  Administered Date(s) Administered   Tdap 01/05/2016    Health Maintenance  Topic Date Due   Hepatitis C Screening  Never done   COVID-19 Vaccine (1) Never done   HIV Screening  Never done   COLONOSCOPY  Never done   INFLUENZA VACCINE  01/03/2021 (Originally 01/04/2020)   TETANUS/TDAP  01/04/2026    Discussed health benefits of physical activity, and encouraged him to engage in regular exercise appropriate for his age and condition.   1. Annual physical exam  - Lipid panel - PSA - Comprehensive Metabolic Panel (CMET)  2. Neuropathy Improved. - Lipid panel - Comprehensive Metabolic Panel (CMET)  3. Screening for prostate cancer  - PSA  4. Screening for blood or protein in urine  - POCT urinalysis dipstick  5. Screening for colon cancer  - Ambulatory referral to Gastroenterology   No follow-ups on file.        Jaynia Fendley Wendelyn Breslow, MD  [facility] [phone] (phone) [phone] (fax)  [facility]

## 2020-04-05 ENCOUNTER — Other Ambulatory Visit: Payer: Self-pay

## 2020-04-05 ENCOUNTER — Telehealth (INDEPENDENT_AMBULATORY_CARE_PROVIDER_SITE_OTHER): Payer: Self-pay | Admitting: Gastroenterology

## 2020-04-05 DIAGNOSIS — Z1211 Encounter for screening for malignant neoplasm of colon: Secondary | ICD-10-CM

## 2020-04-05 MED ORDER — NA SULFATE-K SULFATE-MG SULF 17.5-3.13-1.6 GM/177ML PO SOLN: 1.0000 | Freq: Once | ORAL | 0 refills | Status: AC

## 2020-04-05 NOTE — Progress Notes (Signed)
Gastroenterology Pre-Procedure Review  Request Date: Friday 05/14/20 Requesting Physician: Dr. Tobi Bastos  PATIENT REVIEW QUESTIONS: The patient responded to the following health history questions as indicated:    1. Are you having any GI issues? no 2. Do you have a personal history of Polyps? no 3. Do you have a family history of Colon Cancer or Polyps? no 4. Diabetes Mellitus? no 5. Joint replacements in the past 12 months?no 6. Major health problems in the past 3 months?no 7. Any artificial heart valves, MVP, or defibrillator?no    MEDICATIONS & ALLERGIES:    Patient reports the following regarding taking any anticoagulation/antiplatelet therapy:   Plavix, Coumadin, Eliquis, Xarelto, Lovenox, Pradaxa, Brilinta, or Effient? no Aspirin? no  Patient confirms/reports the following medications:  Current Outpatient Medications  Medication Sig Dispense Refill  . ALPHA LIPOIC ACID PO Take 1 tablet by mouth daily.    . fexofenadine (ALLEGRA) 30 MG tablet Take 30 mg by mouth daily as needed (Allergies).     Marland Kitchen ibuprofen (ADVIL,MOTRIN) 200 MG tablet Take 600 mg by mouth every 8 (eight) hours as needed (pain).     . Multiple Vitamin (MULTIVITAMIN) capsule Take 1 capsule by mouth daily.     No current facility-administered medications for this visit.    Patient confirms/reports the following allergies:  No Known Allergies  No orders of the defined types were placed in this encounter.   AUTHORIZATION INFORMATION Primary Insurance: 1D#: Group #:  Secondary Insurance: 1D#: Group #:  SCHEDULE INFORMATION: Date: Friday 05/14/20 Time: Location:[facility]

## 2020-05-12 ENCOUNTER — Telehealth: Payer: Self-pay | Admitting: Gastroenterology

## 2020-05-12 ENCOUNTER — Other Ambulatory Visit: Payer: Self-pay

## 2020-05-12 ENCOUNTER — Other Ambulatory Visit
Admission: RE | Admit: 2020-05-12 | Payer: 59 | Source: Ambulatory Visit | Attending: Gastroenterology | Admitting: Gastroenterology

## 2020-05-12 DIAGNOSIS — Z1211 Encounter for screening for malignant neoplasm of colon: Secondary | ICD-10-CM

## 2020-05-12 NOTE — Telephone Encounter (Signed)
Pt needs to resch procedure for Friday 05/14/20, please all

## 2020-05-12 NOTE — Telephone Encounter (Signed)
Returned patients call to reschedule Colonoscopy from 05/14/20 with Dr. Tobi Bastos to 07/02/20 with Dr. Tobi Bastos.  Rosann Auerbach has been notified of date change.  Referral updated.  New instructions sent.   Thanks,  [place], [place]

## 2020-05-13 ENCOUNTER — Encounter: Payer: Self-pay | Admitting: *Deleted

## 2020-06-29 ENCOUNTER — Telehealth: Payer: Self-pay

## 2020-06-29 NOTE — Telephone Encounter (Signed)
Patients call has been returned.  He has requested to cancel his colonoscopy due to insurance concerns.  He will call back to reschedule once he gets answer from his insurance.  Thanks,  [place], [place]

## 2020-06-30 ENCOUNTER — Other Ambulatory Visit: Admission: RE | Admit: 2020-06-30 | Payer: 59 | Source: Ambulatory Visit

## 2020-07-02 ENCOUNTER — Encounter: Admission: RE | Payer: Self-pay | Source: Home / Self Care

## 2020-07-02 ENCOUNTER — Ambulatory Visit: Admission: RE | Admit: 2020-07-02 | Payer: 59 | Source: Home / Self Care | Admitting: Gastroenterology

## 2020-07-02 SURGERY — COLONOSCOPY WITH PROPOFOL
Anesthesia: General

## 2021-03-22 ENCOUNTER — Other Ambulatory Visit: Payer: Self-pay

## 2021-03-22 ENCOUNTER — Ambulatory Visit (INDEPENDENT_AMBULATORY_CARE_PROVIDER_SITE_OTHER): Payer: 59 | Admitting: Family Medicine

## 2021-03-22 VITALS — BP 104/62 | HR 62 | Temp 98.5°F | Ht 76.0 in | Wt 208.0 lb

## 2021-03-22 DIAGNOSIS — Z1211 Encounter for screening for malignant neoplasm of colon: Secondary | ICD-10-CM | POA: Diagnosis not present

## 2021-03-22 DIAGNOSIS — Z125 Encounter for screening for malignant neoplasm of prostate: Secondary | ICD-10-CM

## 2021-03-22 DIAGNOSIS — Z0000 Encounter for general adult medical examination without abnormal findings: Secondary | ICD-10-CM

## 2021-03-22 NOTE — Progress Notes (Signed)
Complete physical exam   Patient: Trevor Parker   DOB: 03/01/1971   50 y.o. Male  MRN: [id number] Visit Date: 03/22/2021  Today's healthcare provider: Megan Mans, MD   No chief complaint on file.  Subjective    Trevor Parker is a 50 y.o. male who presents today for a complete physical exam.  He reports consuming a general diet.  He generally feels well. He reports sleeping well. He does not have additional problems to discuss today.  HPI  Patient feels well.  He exercises daily.  He is a Systems analyst.   Past Medical History:  Diagnosis Date   Typical atrial flutter [facility])    Past Surgical History:  Procedure Laterality Date   A-FLUTTER ABLATION N/A 03/13/2018   Procedure: A-FLUTTER ABLATION;  Surgeon: Duke Salvia, MD;  Location: [facility] INVASIVE CV LAB;  Service: Cardiovascular;  Laterality: N/A;   NO PAST SURGERIES     Social History   Socioeconomic History   Marital status: Single    Spouse name: Not on file   Number of children: Not on file   Years of education: Not on file   Highest education level: Not on file  Occupational History   Not on file  Tobacco Use   Smoking status: Never   Smokeless tobacco: Never  Vaping Use   Vaping Use: Never used  Substance and Sexual Activity   Alcohol use: No   Drug use: No   Sexual activity: Not Currently  Other Topics Concern   Not on file  Social History Narrative   Not on file   Social Determinants of Health   Financial Resource Strain: Not on file  Food Insecurity: Not on file  Transportation Needs: Not on file  Physical Activity: Not on file  Stress: Not on file  Social Connections: Not on file  Intimate Partner Violence: Not on file   Family Status  Relation Name Status   Mother  Alive   Father  Alive   Brother  Deceased       due to suicide   Family History  Problem Relation Age of Onset   Breast cancer Father    Suicidality Brother    No Known Allergies  Patient Care  Team: Maple Hudson., MD as PCP - General (Family Medicine)   Medications: Outpatient Medications Prior to Visit  Medication Sig   ALPHA LIPOIC ACID PO Take 1 tablet by mouth daily.   fexofenadine (ALLEGRA) 30 MG tablet Take 30 mg by mouth daily as needed (Allergies).    ibuprofen (ADVIL,MOTRIN) 200 MG tablet Take 600 mg by mouth every 8 (eight) hours as needed (pain).    Multiple Vitamin (MULTIVITAMIN) capsule Take 1 capsule by mouth daily.   No facility-administered medications prior to visit.    Review of Systems  Constitutional: Negative.   HENT: Negative.    Eyes: Negative.   Respiratory: Negative.    Cardiovascular: Negative.   Gastrointestinal: Negative.   Endocrine: Negative.   Genitourinary: Negative.   Musculoskeletal: Negative.   Skin: Negative.   Allergic/Immunologic: Positive for environmental allergies.  Neurological: Negative.   Hematological: Negative.   Psychiatric/Behavioral: Negative.        Objective    BP 104/62 (BP Location: Right Arm, Patient Position: Sitting, Cuff Size: Large)   Pulse 62   Temp 98.5 F (36.9 C) (Oral)   Ht 6\' 4"  (1.93 m)   Wt 208 lb (94.3 kg)   SpO2 99%  BMI 25.32 kg/m  Wt Readings from Last 3 Encounters:  03/22/21 208 lb (94.3 kg)  03/18/20 206 lb 3.2 oz (93.5 kg)  12/16/19 214 lb (97.1 kg)      Physical Exam Constitutional:      Appearance: Normal appearance. He is normal weight.  HENT:     Head: Normocephalic and atraumatic.     Right Ear: Tympanic membrane, ear canal and external ear normal.     Left Ear: Tympanic membrane, ear canal and external ear normal.     Nose: Nose normal.     Mouth/Throat:     Mouth: Mucous membranes are moist.     Pharynx: Oropharynx is clear.  Eyes:     Extraocular Movements: Extraocular movements intact.     Conjunctiva/sclera: Conjunctivae normal.     Pupils: Pupils are equal, round, and reactive to light.  Cardiovascular:     Rate and Rhythm: Normal rate and regular  rhythm.     Pulses: Normal pulses.     Heart sounds: Normal heart sounds.  Pulmonary:     Effort: Pulmonary effort is normal.     Breath sounds: Normal breath sounds.  Abdominal:     General: Abdomen is flat. Bowel sounds are normal.     Palpations: Abdomen is soft.  Genitourinary:    Penis: Normal.      Testes: Normal.  Musculoskeletal:        General: Normal range of motion.     Cervical back: Normal range of motion and neck supple.  Skin:    General: Skin is warm and dry.  Neurological:     General: No focal deficit present.     Mental Status: He is alert and oriented to person, place, and time.  Psychiatric:        Mood and Affect: Mood normal.        Behavior: Behavior normal.        Thought Content: Thought content normal.        Judgment: Judgment normal.      Last depression screening scores PHQ 2/9 Scores 03/22/2021 03/18/2020 12/16/2019  PHQ - 2 Score 0 0 0  PHQ- 9 Score 0 0 0   Last fall risk screening Fall Risk  03/22/2021  Falls in the past year? 0  Number falls in past yr: 0  Injury with Fall? 0  Risk for fall due to : -  Follow up -   Last Audit-C alcohol use screening Alcohol Use Disorder Test (AUDIT) 03/22/2021  1. How often do you have a drink containing alcohol? 0  2. How many drinks containing alcohol do you have on a typical day when you are drinking? 0  3. How often do you have six or more drinks on one occasion? 0  AUDIT-C Score 0  Alcohol Brief Interventions/Follow-up -   A score of 3 or more in women, and 4 or more in men indicates increased risk for alcohol abuse, EXCEPT if all of the points are from question 1   No results found for any visits on 03/22/21.  Assessment & Plan    Routine Health Maintenance and Physical Exam  Exercise Activities and Dietary recommendations  Goals   None     Immunization History  Administered Date(s) Administered   Tdap 01/05/2016    Health Maintenance  Topic Date Due   HIV Screening  Never  done   Hepatitis C Screening  Never done   COLONOSCOPY (Pts 45-5yrs Insurance coverage will need to  be confirmed)  Never done   Zoster Vaccines- Shingrix (1 of 2) Never done   INFLUENZA VACCINE  09/02/2021 (Originally 01/03/2021)   TETANUS/TDAP  01/04/2026   HPV VACCINES  Aged Out    Discussed health benefits of physical activity, and encouraged him to engage in regular exercise appropriate for his age and condition.  1. Annual physical exam Arrange for colonoscopy.  Patient is willing to do it is just a lot cheaper to do in an outpatient setting. - CBC with Differential/Platelet - Comprehensive metabolic panel - Lipid Panel With LDL/HDL Ratio - TSH  2. Prostate cancer screening  - PSA  3. Colon cancer screening  - Ambulatory referral to Gastroenterology   No follow-ups on file.     I, Megan Mans, MD, have reviewed all documentation for this visit. The documentation on 03/27/21 for the exam, diagnosis, procedures, and orders are all accurate and complete.    Ashleynicole Mcclees Wendelyn Breslow, MD  [facility] [phone] (phone) [phone] (fax)  [facility]

## 2021-03-23 LAB — CBC WITH DIFFERENTIAL/PLATELET
Basophils Absolute: 0 10*3/uL (ref 0.0–0.2)
Basos: 1 %
EOS (ABSOLUTE): 0.1 10*3/uL (ref 0.0–0.4)
Eos: 2 %
Hematocrit: 40 % (ref 37.5–51.0)
Hemoglobin: 14.1 g/dL (ref 13.0–17.7)
Immature Grans (Abs): 0 10*3/uL (ref 0.0–0.1)
Immature Granulocytes: 0 %
Lymphocytes Absolute: 1.3 10*3/uL (ref 0.7–3.1)
Lymphs: 21 %
MCH: 30.6 pg (ref 26.6–33.0)
MCHC: 35.3 g/dL (ref 31.5–35.7)
MCV: 87 fL (ref 79–97)
Monocytes Absolute: 0.4 10*3/uL (ref 0.1–0.9)
Monocytes: 7 %
Neutrophils Absolute: 4.3 10*3/uL (ref 1.4–7.0)
Neutrophils: 69 %
Platelets: 190 10*3/uL (ref 150–450)
RBC: 4.61 x10E6/uL (ref 4.14–5.80)
RDW: 13.5 % (ref 11.6–15.4)
WBC: 6.2 10*3/uL (ref 3.4–10.8)

## 2021-03-23 LAB — COMPREHENSIVE METABOLIC PANEL
ALT: 38 IU/L (ref 0–44)
AST: 25 IU/L (ref 0–40)
Albumin/Globulin Ratio: 1.8 (ref 1.2–2.2)
Albumin: 4.4 g/dL (ref 3.8–4.9)
Alkaline Phosphatase: 68 IU/L (ref 44–121)
BUN/Creatinine Ratio: 22 — ABNORMAL HIGH (ref 9–20)
BUN: 24 mg/dL (ref 6–24)
Bilirubin Total: 0.3 mg/dL (ref 0.0–1.2)
CO2: 23 mmol/L (ref 20–29)
Calcium: 9.7 mg/dL (ref 8.7–10.2)
Chloride: 105 mmol/L (ref 96–106)
Creatinine, Ser: 1.1 mg/dL (ref 0.76–1.27)
Globulin, Total: 2.5 g/dL (ref 1.5–4.5)
Glucose: 88 mg/dL (ref 70–99)
Potassium: 4.7 mmol/L (ref 3.5–5.2)
Sodium: 143 mmol/L (ref 134–144)
Total Protein: 6.9 g/dL (ref 6.0–8.5)
eGFR: 81 mL/min/{1.73_m2} (ref >59)

## 2021-03-23 LAB — PSA: Prostate Specific Ag, Serum: 3.6 ng/mL (ref 0.0–4.0)

## 2021-03-23 LAB — LIPID PANEL WITH LDL/HDL RATIO
Cholesterol, Total: 188 mg/dL (ref 100–199)
HDL: 73 mg/dL (ref >39)
LDL Chol Calc (NIH): 103 mg/dL — ABNORMAL HIGH (ref 0–99)
LDL/HDL Ratio: 1.4 ratio (ref 0.0–3.6)
Triglycerides: 66 mg/dL (ref 0–149)
VLDL Cholesterol Cal: 12 mg/dL (ref 5–40)

## 2021-03-23 LAB — TSH: TSH: 1.18 u[IU]/mL (ref 0.450–4.500)

## 2021-04-05 ENCOUNTER — Other Ambulatory Visit: Payer: Self-pay

## 2021-04-05 DIAGNOSIS — Z1211 Encounter for screening for malignant neoplasm of colon: Secondary | ICD-10-CM

## 2021-04-05 MED ORDER — PEG 3350-KCL-NA BICARB-NACL 420 G PO SOLR: 4000.0000 mL | Freq: Once | ORAL | 0 refills | Status: AC

## 2021-04-05 NOTE — Progress Notes (Signed)
Gastroenterology Pre-Procedure Review  Request Date: 05/06/21 Requesting Physician: Dr. Servando Snare  PATIENT REVIEW QUESTIONS: The patient responded to the following health history questions as indicated:    1. Are you having any GI issues? no 2. Do you have a personal history of Polyps? no 3. Do you have a family history of Colon Cancer or Polyps? no 4. Diabetes Mellitus? no 5. Joint replacements in the past 12 months?no 6. Major health problems in the past 3 months?no 7. Any artificial heart valves, MVP, or defibrillator?no    MEDICATIONS & ALLERGIES:    Patient reports the following regarding taking any anticoagulation/antiplatelet therapy:   Plavix, Coumadin, Eliquis, Xarelto, Lovenox, Pradaxa, Brilinta, or Effient? no Aspirin? no  Patient confirms/reports the following medications:  Current Outpatient Medications  Medication Sig Dispense Refill   ALPHA LIPOIC ACID PO Take 1 tablet by mouth daily.     fexofenadine (ALLEGRA) 30 MG tablet Take 30 mg by mouth daily as needed (Allergies).      ibuprofen (ADVIL,MOTRIN) 200 MG tablet Take 600 mg by mouth every 8 (eight) hours as needed (pain).      Multiple Vitamin (MULTIVITAMIN) capsule Take 1 capsule by mouth daily.     No current facility-administered medications for this visit.    Patient confirms/reports the following allergies:  No Known Allergies  No orders of the defined types were placed in this encounter.   AUTHORIZATION INFORMATION Primary Insurance: 1D#: Group #:  Secondary Insurance: 1D#: Group #:  SCHEDULE INFORMATION: Date: 05/06/21 Time: Location: [facility]

## 2021-04-25 ENCOUNTER — Encounter: Payer: Self-pay | Admitting: Gastroenterology

## 2021-05-06 ENCOUNTER — Other Ambulatory Visit: Payer: Self-pay

## 2021-05-06 ENCOUNTER — Ambulatory Visit: Payer: 59 | Admitting: Anesthesiology

## 2021-05-06 ENCOUNTER — Ambulatory Visit
Admission: RE | Admit: 2021-05-06 | Discharge: 2021-05-06 | Disposition: A | Discharge status: Home / Self Care | Payer: 59 | Attending: Gastroenterology | Admitting: Gastroenterology

## 2021-05-06 ENCOUNTER — Encounter
Admission: RE | Disposition: A | Discharge status: Home / Self Care | Payer: Self-pay | Source: Home / Self Care | Attending: Gastroenterology

## 2021-05-06 ENCOUNTER — Encounter: Payer: Self-pay | Admitting: Gastroenterology

## 2021-05-06 DIAGNOSIS — K648 Other hemorrhoids: Secondary | ICD-10-CM | POA: Diagnosis not present

## 2021-05-06 DIAGNOSIS — I4891 Unspecified atrial fibrillation: Secondary | ICD-10-CM | POA: Insufficient documentation

## 2021-05-06 DIAGNOSIS — K5730 Diverticulosis of large intestine without perforation or abscess without bleeding: Secondary | ICD-10-CM | POA: Insufficient documentation

## 2021-05-06 DIAGNOSIS — Z1211 Encounter for screening for malignant neoplasm of colon: Secondary | ICD-10-CM

## 2021-05-06 HISTORY — PX: COLONOSCOPY WITH PROPOFOL: SHX5780

## 2021-05-06 SURGERY — COLONOSCOPY WITH PROPOFOL
Anesthesia: General | Site: Rectum

## 2021-05-06 MED ORDER — STERILE WATER FOR IRRIGATION IR SOLN
Status: DC | PRN
  Administered 2021-05-06: 1

## 2021-05-06 MED ORDER — PROPOFOL 10 MG/ML IV BOLUS
INTRAVENOUS | Status: DC | PRN
  Administered 2021-05-06: 150 mg via INTRAVENOUS
  Administered 2021-05-06: 30 mg via INTRAVENOUS
  Administered 2021-05-06 (×2): 20 mg via INTRAVENOUS

## 2021-05-06 MED ORDER — SODIUM CHLORIDE 0.9 % IV SOLN: INTRAVENOUS | Status: DC

## 2021-05-06 MED ORDER — LIDOCAINE HCL (CARDIAC) PF 100 MG/5ML IV SOSY
PREFILLED_SYRINGE | INTRAVENOUS | Status: DC | PRN
  Administered 2021-05-06: 30 mg via INTRAVENOUS

## 2021-05-06 MED ORDER — ONDANSETRON HCL 4 MG/2ML IJ SOLN: 4.0000 mg | Freq: Once | INTRAMUSCULAR | Status: DC | PRN

## 2021-05-06 MED ORDER — ACETAMINOPHEN 325 MG PO TABS: 325.0000 mg | ORAL_TABLET | ORAL | Status: DC | PRN

## 2021-05-06 MED ORDER — ACETAMINOPHEN 160 MG/5ML PO SOLN: 325.0000 mg | ORAL | Status: DC | PRN

## 2021-05-06 MED ORDER — LACTATED RINGERS IV SOLN: INTRAVENOUS | Status: DC

## 2021-05-06 MED ORDER — STERILE WATER FOR IRRIGATION IR SOLN
Status: DC | PRN
  Administered 2021-05-06 (×2): 60 mL

## 2021-05-06 SURGICAL SUPPLY — 6 items
GOWN CVR UNV OPN BCK APRN NK (MISCELLANEOUS) ×2 IMPLANT
GOWN ISOL THUMB LOOP REG UNIV (MISCELLANEOUS) ×4
KIT PRC NS LF DISP ENDO (KITS) ×1 IMPLANT
KIT PROCEDURE OLYMPUS (KITS) ×2
MANIFOLD NEPTUNE II (INSTRUMENTS) ×2 IMPLANT
WATER STERILE IRR 250ML POUR (IV SOLUTION) ×2 IMPLANT

## 2021-05-06 NOTE — Anesthesia Postprocedure Evaluation (Signed)
Anesthesia Post Note  Patient: Trevor Parker  Procedure(s) Performed: COLONOSCOPY WITH PROPOFOL (Rectum)     Patient location during evaluation: PACU Anesthesia Type: General Level of consciousness: awake and alert Pain management: pain level controlled Vital Signs Assessment: post-procedure vital signs reviewed and stable Respiratory status: spontaneous breathing, nonlabored ventilation and respiratory function stable Cardiovascular status: blood pressure returned to baseline and stable Postop Assessment: no apparent nausea or vomiting Anesthetic complications: no   No notable events documented.  Loni Beckwith

## 2021-05-06 NOTE — Anesthesia Preprocedure Evaluation (Signed)
Anesthesia Evaluation  Patient identified by MRN, date of birth, ID band Patient awake    Reviewed: Allergy & Precautions, H&P , NPO status , Patient's Chart, lab work & pertinent test results  Airway Mallampati: II  TM Distance: >3 FB Neck ROM: Full    Dental no notable dental hx. (+) Teeth Intact, Dental Advisory Given   Pulmonary neg pulmonary ROS,    Pulmonary exam normal breath sounds clear to auscultation       Cardiovascular Exercise Tolerance: Good + dysrhythmias (s/p ablation) Atrial Fibrillation  Rhythm:Regular Rate:Normal     Neuro/Psych negative neurological ROS  negative psych ROS   GI/Hepatic negative GI ROS, Neg liver ROS,   Endo/Other  negative endocrine ROS  Renal/GU negative Renal ROS  negative genitourinary   Musculoskeletal   Abdominal   Peds  Hematology negative hematology ROS (+)   Anesthesia Other Findings Some intermittent AV conduction block (1:4-1:8 when visible)  Reproductive/Obstetrics negative OB ROS                             Anesthesia Physical  Anesthesia Plan  ASA: 3  Anesthesia Plan: General   Post-op Pain Management:    Induction: Intravenous  PONV Risk Score and Plan: 2 and TIVA and Treatment may vary due to age or medical condition  Airway Management Planned: LMA and Oral ETT  Additional Equipment:   Intra-op Plan:   Post-operative Plan: Extubation in OR  Informed Consent: I have reviewed the patients History and Physical, chart, labs and discussed the procedure including the risks, benefits and alternatives for the proposed anesthesia with the patient or authorized representative who has indicated his/her understanding and acceptance.     Dental advisory given  Plan Discussed with: CRNA  Anesthesia Plan Comments:         Anesthesia Quick Evaluation

## 2021-05-06 NOTE — Transfer of Care (Signed)
Immediate Anesthesia Transfer of Care Note  Patient: Trevor Parker  Procedure(s) Performed: COLONOSCOPY WITH PROPOFOL (Rectum)  Patient Location: PACU  Anesthesia Type: General  Level of Consciousness: awake, alert  and patient cooperative  Airway and Oxygen Therapy: Patient Spontanous Breathing and Patient connected to supplemental oxygen  Post-op Assessment: Post-op Vital signs reviewed, Patient's Cardiovascular Status Stable, Respiratory Function Stable, Patent Airway and No signs of Nausea or vomiting  Post-op Vital Signs: Reviewed and stable  Complications: No notable events documented.

## 2021-05-06 NOTE — Anesthesia Procedure Notes (Signed)
Date/Time: 05/06/2021 9:01 AM Performed by: Maree Krabbe, CRNA Pre-anesthesia Checklist: Patient identified, Emergency Drugs available, Suction available, Timeout performed and Patient being monitored Patient Re-evaluated:Patient Re-evaluated prior to induction Oxygen Delivery Method: Nasal cannula Placement Confirmation: positive ETCO2

## 2021-05-06 NOTE — Op Note (Signed)
[facility] Patient Name: Trevor Parker Procedure Date: 05/06/2021 8:57 AM MRN: [id number] Account #: [id number] Date of Birth: 01-28-1971 Admit Type: Outpatient Age: 50 Room: [facility] OR ROOM 01 Gender: Male Note Status: Finalized Instrument Name: [id number] Procedure:             Colonoscopy Indications:           Screening for colorectal malignant neoplasm Providers:             Midge Minium MD, MD Referring MD:          Ferdinand Lango. Sullivan Lone, MD (Referring MD) Medicines:             Propofol per Anesthesia Complications:         No immediate complications. Procedure:             Pre-Anesthesia Assessment:                        - Prior to the procedure, a History and Physical was                         performed, and patient medications and allergies were                         reviewed. The patient's tolerance of previous                         anesthesia was also reviewed. The risks and benefits                         of the procedure and the sedation options and risks                         were discussed with the patient. All questions were                         answered, and informed consent was obtained. Prior                         Anticoagulants: The patient has taken no previous                         anticoagulant or antiplatelet agents. ASA Grade                         Assessment: II - A patient with mild systemic disease.                         After reviewing the risks and benefits, the patient                         was deemed in satisfactory condition to undergo the                         procedure.                        After obtaining informed consent, the colonoscope was  passed under direct vision. Throughout the procedure,                         the patient's blood pressure, pulse, and oxygen                         saturations were monitored continuously. The                          Colonoscope was introduced through the anus and                         advanced to the the cecum, identified by appendiceal                         orifice and ileocecal valve. The colonoscopy was                         performed without difficulty. The patient tolerated                         the procedure well. The quality of the bowel                         preparation was excellent. Findings:      The perianal and digital rectal examinations were normal.      A few small-mouthed diverticula were found in the sigmoid colon.      Non-bleeding internal hemorrhoids were found during retroflexion. The       hemorrhoids were Grade I (internal hemorrhoids that do not prolapse). Impression:            - Diverticulosis in the sigmoid colon.                        - Non-bleeding internal hemorrhoids.                        - No specimens collected. Recommendation:        - Discharge patient to home.                        - Resume previous diet.                        - Continue present medications.                        - Repeat colonoscopy in 10 years for screening                         purposes. Procedure Code(s):     --- Professional ---                        [phone], Colonoscopy, flexible; diagnostic, including                         collection of specimen(s) by brushing or washing, when                         performed (separate procedure)  Diagnosis Code(s):     --- Professional ---                        Z12.11, Encounter for screening for malignant neoplasm                         of colon CPT copyright 2019 American Medical Association. All rights reserved. The codes documented in this report are preliminary and upon coder review may  be revised to meet current compliance requirements. Midge Minium MD, MD 05/06/2021 9:19:03 AM This report has been signed electronically. Number of Addenda: 0 Note Initiated On: 05/06/2021 8:57 AM Scope Withdrawal Time: 0 hours 8 minutes 46  seconds  Total Procedure Duration: 0 hours 11 minutes 34 seconds  Estimated Blood Loss:  Estimated blood loss: none.      [facility]

## 2021-05-06 NOTE — H&P (Signed)
   Trevor Minium, MD [facility] [address] [place], [place] [address] Phone: [phone] Fax : [phone]  Primary Care Physician:  Maple Hudson., MD Primary Gastroenterologist:  Dr. Servando Snare  Pre-Procedure History & Physical: HPI:  Trayven Lumadue is a 50 y.o. male is here for a screening colonoscopy.   Past Medical History:  Diagnosis Date   Typical atrial flutter [facility])     Past Surgical History:  Procedure Laterality Date   A-FLUTTER ABLATION N/A 03/13/2018   Procedure: A-FLUTTER ABLATION;  Surgeon: Duke Salvia, MD;  Location: [facility] INVASIVE CV LAB;  Service: Cardiovascular;  Laterality: N/A;    Prior to Admission medications   Medication Sig Start Date End Date Taking? Authorizing Provider  fexofenadine (ALLEGRA) 30 MG tablet Take 30 mg by mouth daily as needed (Allergies).    Yes [provider]  ibuprofen (ADVIL,MOTRIN) 200 MG tablet Take 600 mg by mouth every 8 (eight) hours as needed (pain).    Yes [provider]  Multiple Vitamin (MULTIVITAMIN) capsule Take 1 capsule by mouth daily. Patient not taking: Reported on 04/25/2021    [provider]    Allergies as of 04/05/2021   (No Known Allergies)    Family History  Problem Relation Age of Onset   Breast cancer Father    Suicidality Brother     Social History   Socioeconomic History   Marital status: Single    Spouse name: Not on file   Number of children: Not on file   Years of education: Not on file   Highest education level: Not on file  Occupational History   Not on file  Tobacco Use   Smoking status: Never   Smokeless tobacco: Never  Vaping Use   Vaping Use: Never used  Substance and Sexual Activity   Alcohol use: No   Drug use: No   Sexual activity: Not Currently  Other Topics Concern   Not on file  Social History Narrative   Not on file   Social Determinants of Health   Financial Resource Strain: Not on file  Food Insecurity: Not on file   Transportation Needs: Not on file  Physical Activity: Not on file  Stress: Not on file  Social Connections: Not on file  Intimate Partner Violence: Not on file    Review of Systems: See HPI, otherwise negative ROS  Physical Exam: BP 139/84   Pulse 66   Temp 98.1 F (36.7 C) (Temporal)   Resp 18   Ht 6\' 4"  (1.93 m)   Wt 89.9 kg   SpO2 100%   BMI 24.11 kg/m  General:   Alert,  pleasant and cooperative in NAD Head:  Normocephalic and atraumatic. Neck:  Supple; no masses or thyromegaly. Lungs:  Clear throughout to auscultation.    Heart:  Regular rate and rhythm. Abdomen:  Soft, nontender and nondistended. Normal bowel sounds, without guarding, and without rebound.   Neurologic:  Alert and  oriented x4;  grossly normal neurologically.  Impression/Plan: Jr Milliron is now here to undergo a screening colonoscopy.  Risks, benefits, and alternatives regarding colonoscopy have been reviewed with the patient.  Questions have been answered.  All parties agreeable.

## 2021-05-09 ENCOUNTER — Encounter: Payer: Self-pay | Admitting: Gastroenterology

## 2021-10-05 IMAGING — MR MR CARD MORPHOLOGY WO/W CM
36 of 38 series · 36 of 40 positions shown · IV contrast (Gadavist)
Comparison: Cardiac MRI 07/10/2018

EXAM:
CARDIAC MRI

CLINICAL DATA: Cardiomyopathy hypertrophic suspected left
ventricular hypertrophy
TECHNIQUE: The patient was scanned on a 1.5 Tesla GE magnet. A dedicated
cardiac coil was used. Functional imaging was done using Fiesta
sequences. [DATE], and 4 chamber views were done to assess for RWMA's.
Modified Klpigbb rule using a short axis stack was used to
calculate an ejection fraction on a dedicated work station using
Circle software. The patient received 10mL GADAVIST GADOBUTROL 1
MMOL/ML IV SOLN. After 10 minutes inversion recovery sequences were
used to assess for infiltration and scar tissue.

CONTRAST:  10mL GADAVIST GADOBUTROL 1 MMOL/ML IV SOLN

[Series 6: bSSFP · oblique · 8.0mm · 1.61mm/px · 1 of 25 slices shown (1 of 22)]
[im 1/25]
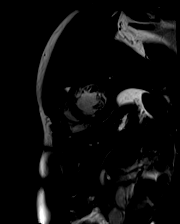

[Series 6: bSSFP · oblique · 8.0mm · 1.61mm/px · 1 of 25 slices shown (2 of 22)]
[im 1/25]
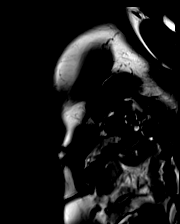

[Series 6: bSSFP · oblique · 8.0mm · 1.61mm/px · 1 of 25 slices shown (3 of 22)]
[im 1/25]
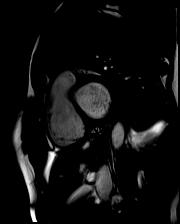

[Series 6: bSSFP · oblique · 8.0mm · 1.61mm/px · 1 of 25 slices shown (4 of 22)]
[im 1/25]
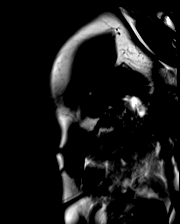

[Series 6: bSSFP · oblique · 8.0mm · 1.61mm/px · 1 of 25 slices shown (5 of 22)]
[im 1/25]
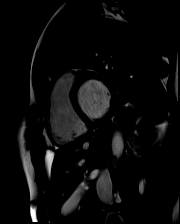

[Series 6: bSSFP · oblique · 8.0mm · 1.61mm/px · 1 of 25 slices shown (6 of 22)]
[im 1/25]
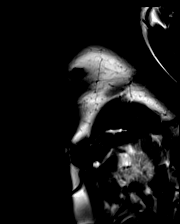

[Series 6: bSSFP · oblique · 8.0mm · 1.61mm/px · 1 of 25 slices shown (7 of 22)]
[im 1/25]
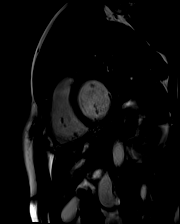

[Series 6: bSSFP · oblique · 8.0mm · 1.61mm/px · 1 of 25 slices shown (8 of 22)]
[im 1/25]
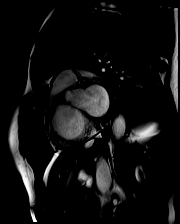

[Series 6: bSSFP · oblique · 8.0mm · 1.61mm/px · 1 of 25 slices shown (9 of 22)]
[im 1/25]
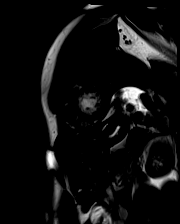

[Series 6: bSSFP · oblique · 8.0mm · 1.61mm/px · 1 of 25 slices shown (10 of 22)]
[im 1/25]
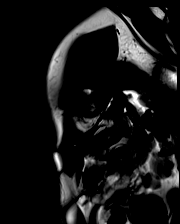

[Series 6: bSSFP · oblique · 8.0mm · 1.61mm/px · 1 of 25 slices shown (11 of 22)]
[im 1/25]
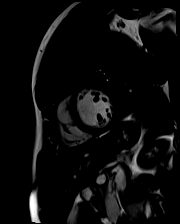

[Series 6: bSSFP · oblique · 8.0mm · 1.61mm/px · 1 of 25 slices shown (12 of 22)]
[im 1/25]
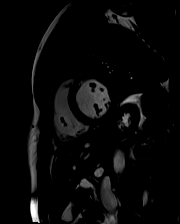

[Series 6: bSSFP · oblique · 8.0mm · 1.61mm/px · 1 of 25 slices shown (13 of 22)]
[im 1/25]
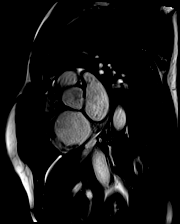

[Series 6: bSSFP · oblique · 8.0mm · 1.61mm/px · 1 of 25 slices shown (14 of 22)]
[im 1/25]
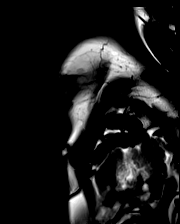

[Series 6: bSSFP · oblique · 8.0mm · 1.61mm/px · 1 of 25 slices shown (15 of 22)]
[im 1/25]
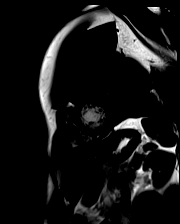

[Series 6: bSSFP · oblique · 8.0mm · 1.61mm/px · 1 of 25 slices shown (16 of 22)]
[im 1/25]
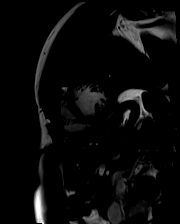

[Series 6: bSSFP · oblique · 8.0mm · 1.61mm/px · 1 of 25 slices shown (17 of 22)]
[im 1/25]
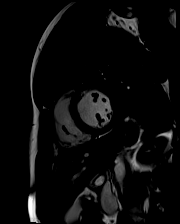

[Series 6: bSSFP · oblique · 8.0mm · 1.61mm/px · 1 of 25 slices shown (18 of 22)]
[im 1/25]
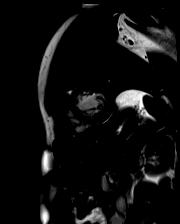

[Series 6: bSSFP · oblique · 8.0mm · 1.61mm/px · 1 of 25 slices shown (19 of 22)]
[im 1/25]
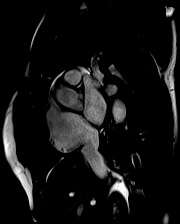

[Series 8: t2_stir_db_sax · oblique · 8.0mm · 1.73mm/px · 1 of 19 slices shown]
[im 1/19]
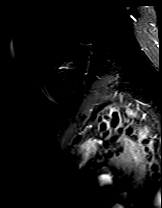

[Series 10: (id)_long_t1 · oblique · 8.0mm · 1.41mm/px · 1 of 24 slices shown]
[im 1/24]
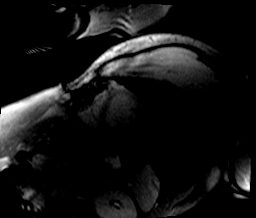

[Series 11: (id)_long_t1_moco · oblique · 8.0mm · 1.41mm/px · 1 of 24 slices shown]
[im 1/24]
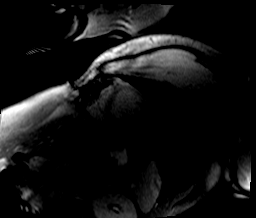

[Series 12: (id)_long_t1_moco_t1 · oblique · 8.0mm · 1.41mm/px · 1 of 6 slices shown]
[im 1/6]
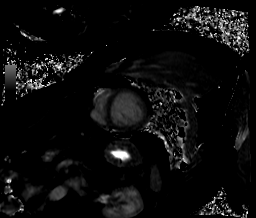

[Series 14: (id)_trufi · oblique · 8.0mm · 1.88mm/px · 1 of 9 slices shown]
[im 1/9]
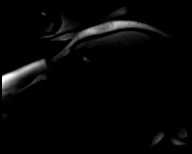

[Series 15: (id)_trufi_moco · oblique · 8.0mm · 1.88mm/px · 1 of 9 slices shown]
[im 1/9]
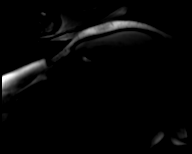

[Series 16: (id)_trufi_moco_t2 · oblique · 8.0mm · 1.88mm/px · 1 of 3 slices shown]
[im 1/3]
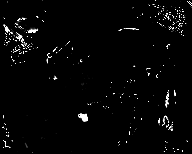

[Series 18: bSSFP · oblique · 6.0mm · 1.41mm/px · 1 of 25 slices shown (20 of 22)]
[im 1/25]
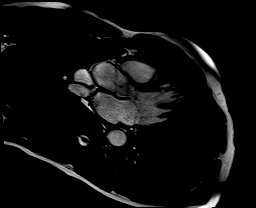

[Series 19: bSSFP · oblique · 6.0mm · 1.41mm/px · 1 of 25 slices shown (21 of 22)]
[im 1/25]
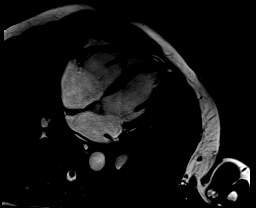

[Series 20: bSSFP · coronal · 6.0mm · 1.41mm/px · 1 of 25 slices shown (22 of 22)]
[im 1/25]
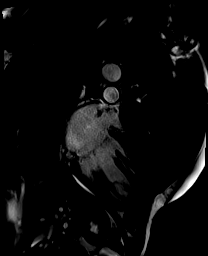

[Series 22: lge_single shot sa · oblique · 8.0mm · 1.88mm/px · 1 of 19 slices shown (1 of 2)]
[im 1/19]
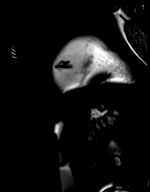

[Series 23: lge_single shot sa · oblique · 8.0mm · 1.88mm/px · 1 of 19 slices shown (2 of 2)]
[im 1/19]
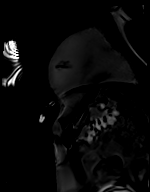

[Series 30: (id)_short_t1 · oblique · 8.0mm · 1.41mm/px · 1 of 27 slices shown]
[im 1/27]
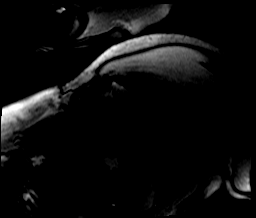

[Series 31: (id)_short_t1_moco · oblique · 8.0mm · 1.41mm/px · 1 of 27 slices shown]
[im 1/27]
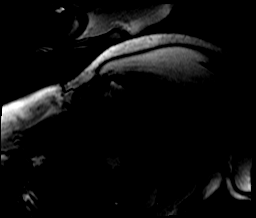

[Series 32: (id)_short_t1_moco_t1 · oblique · 8.0mm · 1.41mm/px · 1 of 6 slices shown]
[im 1/6]
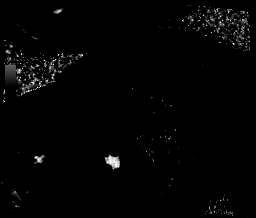

[Series 37: lge short axis_mag · oblique · 8.0mm · 1.61mm/px · 1 of 19 slices shown]
[im 1/19]
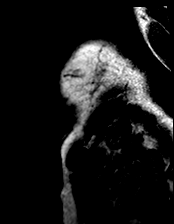

[Series 38: lge short axis_psir · oblique · 8.0mm · 1.61mm/px · 1 of 19 slices shown]
[im 1/19]
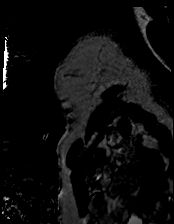

[36 of 40 positions shown; findings below may reference images not displayed]

FINDINGS: LEFT VENTRICLE:

Normal left ventricular size, normal wall thickness. Qualitatively
normal LV systolic function, unable to calculate volumes or ejection
fraction accurately due to artifact from arrhythmia.

Prominent LV apical trabeculations, noncompacted to compacted ratio
of 1.3, does not meet criteria for noncompaction cardiomyopathy.

There are no regional wall motion abnormalities.

There is no late gadolinium enhancement in the left ventricular
myocardium.

Normal T1 myocardial nulling kinetics, suggesting against an
infiltrative cardiomyopathy or cardiac amyloidosis.

T1 pre contrast myocardium: 989 ms

KAMLESH1 pre contrast blood pool: 2666 ms

KAMLESH1 post contrast myocardium: 454 ms

KAMLESH1 post contrast blood pool: 299 ms

ECV: 26% with hematocrit 40.1%.

RIGHT VENTRICLE:

Normal right ventricular size, thickness. Qualitatively normal RV
systolic function, unable to calculate volumes or ejection fraction
accurately due to artifact from arrhythmia.

There are no regional wall motion abnormalities.

No delayed myocardial enhancement in the right ventricle.

ATRIA:

Mild biatrial chamber enlargement.

VALVES:

No significant valvular abnormalities.

PERICARDIUM:

Normal pericardium.  No pericardial effusion.

OTHER:

MEASUREMENTS: Volumetric assessment degraded by artifact from
arrhythmia. Volumes performed but not reported due to interpreter
judgement.
IMPRESSION: 1. Normal biventricular chamber size. Qualitatively normal
biventricular function.

2. No delayed myocardial enhancement seen. No evidence of
infarction, scar, fibrosis or infiltrative process.

3. Normal T1 myocardial nulling kinetics suggests against a
diagnosis of cardiac amyloidosis.

Images compared to the prior study 07/10/2018, no significant change
is noted.

## 2021-12-19 ENCOUNTER — Telehealth: Payer: Self-pay | Admitting: Internal Medicine

## 2021-12-19 NOTE — Telephone Encounter (Signed)
3 attempts to schedule fu appt from recall list.   Deleting recall.   

## 2022-03-28 ENCOUNTER — Encounter: Payer: 59 | Admitting: Family Medicine

## 2022-04-18 ENCOUNTER — Encounter: Payer: 59 | Admitting: Family Medicine

## 2022-04-18 NOTE — Progress Notes (Deleted)
I,Trevor Parker E Trevor Parker,acting as a scribe for [employer], MD.,have documented all relevant documentation on the behalf of Trevor Ramp, MD,as directed by  Trevor Ramp, MD while in the presence of Trevor Ramp, MD.  Complete physical exam   Patient: Trevor Parker   DOB: [birth year]   51 y.o. Male  MRN: [id number] Visit Date: 04/18/2022  Today's healthcare provider: Ronnald Ramp, MD   No chief complaint on file.  Subjective    Trevor Parker is a 51 y.o. male who presents today for a complete physical exam.  He reports consuming a {diet types:17450} diet. {Exercise:19826} He generally feels {well/fairly well/poorly:18703}. He reports sleeping {well/fairly well/poorly:18703}. He {does/does not:200015} have additional problems to discuss today.  HPI  ***  Past Medical History:  Diagnosis Date   Typical atrial flutter [facility])    Past Surgical History:  Procedure Laterality Date   A-FLUTTER ABLATION N/A 03/13/2018   Procedure: A-FLUTTER ABLATION;  Surgeon: Duke Salvia, MD;  Location: [facility] INVASIVE CV LAB;  Service: Cardiovascular;  Laterality: N/A;   COLONOSCOPY WITH PROPOFOL N/A 05/06/2021   Procedure: COLONOSCOPY WITH PROPOFOL;  Surgeon: Midge Minium, MD;  Location: [facility];  Service: Endoscopy;  Laterality: N/A;   Social History   Socioeconomic History   Marital status: Single    Spouse name: Not on file   Number of children: Not on file   Years of education: Not on file   Highest education level: Not on file  Occupational History   Not on file  Tobacco Use   Smoking status: Never   Smokeless tobacco: Never  Vaping Use   Vaping Use: Never used  Substance and Sexual Activity   Alcohol use: No   Drug use: No   Sexual activity: Not Currently  Other Topics Concern   Not on file  Social History Narrative   Not on file   Social Determinants of Health   Financial Resource Strain: Not on file   Food Insecurity: Not on file  Transportation Needs: Not on file  Physical Activity: Not on file  Stress: Not on file  Social Connections: Not on file  Intimate Partner Violence: Not on file   Family Status  Relation Name Status   Mother  Alive   Father  Alive   Brother  Deceased       due to suicide   Family History  Problem Relation Age of Onset   Breast cancer Father    Suicidality Brother    No Known Allergies  Patient Care Team: Maple Hudson., MD as PCP - General (Family Medicine)   Medications: Outpatient Medications Prior to Visit  Medication Sig   fexofenadine (ALLEGRA) 30 MG tablet Take 30 mg by mouth daily as needed (Allergies).    ibuprofen (ADVIL,MOTRIN) 200 MG tablet Take 600 mg by mouth every 8 (eight) hours as needed (pain).    Multiple Vitamin (MULTIVITAMIN) capsule Take 1 capsule by mouth daily. (Patient not taking: Reported on 04/25/2021)   No facility-administered medications prior to visit.    Review of Systems  All other systems reviewed and are negative.   {Labs  Heme  Chem  Endocrine  Serology  Results Review (optional):23779}  Objective    There were no vitals taken for this visit. {Show previous vital signs (optional):23777}   Physical Exam  ***  Last depression screening scores    03/22/2021    2:14 PM 03/18/2020    3:16 PM 12/16/2019    1:30 PM  PHQ 2/9 Scores  PHQ - 2 Score 0 0 0  PHQ- 9 Score 0 0 0   Last fall risk screening    03/22/2021    2:14 PM  [place] in the past year? 0  Number falls in past yr: 0  Injury with Fall? 0   Last Audit-C alcohol use screening    03/22/2021    2:14 PM  Alcohol Use Disorder Test (AUDIT)  1. How often do you have a drink containing alcohol? 0  2. How many drinks containing alcohol do you have on a typical day when you are drinking? 0  3. How often do you have six or more drinks on one occasion? 0  AUDIT-C Score 0   A score of 3 or more in women, and 4 or  more in men indicates increased risk for alcohol abuse, EXCEPT if all of the points are from question 1   No results found for any visits on 04/18/22.  Assessment & Plan    Routine Health Maintenance and Physical Exam  Exercise Activities and Dietary recommendations  Goals   None     Immunization History  Administered Date(s) Administered   Tdap 01/05/2016    Health Maintenance  Topic Date Due   HIV Screening  Never done   Hepatitis C Screening  Never done   Zoster Vaccines- Shingrix (1 of 2) Never done   INFLUENZA VACCINE  Never done   TETANUS/TDAP  01/04/2026   COLONOSCOPY (Pts 45-26yrs Insurance coverage will need to be confirmed)  05/07/2031   HPV VACCINES  Aged Out    Discussed health benefits of physical activity, and encouraged him to engage in regular exercise appropriate for his age and condition.  ***  No follow-ups on file.     {provider attestation***:1}   Eulis Foster, MD  [facility] [phone] (phone) [phone] (fax)  [place]

## 2022-07-13 ENCOUNTER — Ambulatory Visit: Payer: 59 | Attending: Internal Medicine | Admitting: Internal Medicine

## 2022-07-13 VITALS — BP 112/70 | HR 47 | Ht 76.0 in | Wt 207.0 lb

## 2022-07-13 DIAGNOSIS — I517 Cardiomegaly: Secondary | ICD-10-CM

## 2022-07-13 DIAGNOSIS — I483 Typical atrial flutter: Secondary | ICD-10-CM

## 2022-07-13 NOTE — Progress Notes (Addendum)
Patient Care Team: Eulas Post, MD as PCP - General (Family Medicine)   HPI  Trevor Parker is a 52 y.o. male seen after hiatus of more than 3 years.  Has a history of atrial flutter for which he underwent catheter ablation 10/19.  Had a mild cardiomyopathy (abnormal SAECG) and conduction system disease and underwent cMRI which was normal  Has a history of sinus bradycardia with second-degree AV block type I  The patient denies chest pain, shortness of breath, nocturnal dyspnea, orthopnea or peripheral edema.  There have been no palpitations, lightheadedness or syncope.      DATE TEST EF    7/19 Echo   55 % LA 67m; MR mild-mod  12/19  Echo 55% LA 35 MR mild  2/20  cMRI 60%  Normal   12/20 cMRI 60% Normal    Date Cr Hgb  7/19 1.01 13.8           Date fQRS Dur LAS RMS 40  2/20 119 41 20  8/20 140 42 7  2/24 122 46 17     Records and Results Reviewed   Past Medical History:  Diagnosis Date   Typical atrial flutter ([facility]     Past Surgical History:  Procedure Laterality Date   A-FLUTTER ABLATION N/A 03/13/2018   Procedure: A-FLUTTER ABLATION;  Surgeon: KDeboraha Sprang MD;  [facility];  Service: Cardiovascular;  Laterality: N/A;   COLONOSCOPY WITH PROPOFOL N/A 05/06/2021   Procedure: COLONOSCOPY WITH PROPOFOL;  Surgeon: WLucilla Lame MD;  [place] [facility]  Service: Endoscopy;  Laterality: N/A;    Current Meds  Medication Sig   fexofenadine (ALLEGRA) 30 MG tablet Take 30 mg by mouth daily as needed (Allergies).    ibuprofen (ADVIL,MOTRIN) 200 MG tablet Take 600 mg by mouth every 8 (eight) hours as needed (pain).    Multiple Vitamin (MULTIVITAMIN) capsule Take 1 capsule by mouth daily.    No Known Allergies  Social History   Tobacco Use   Smoking status: Never   Smokeless tobacco: Never  Vaping Use   Vaping Use: Never used  Substance Use Topics   Alcohol use: No   Drug use: No   Family History  Problem  Relation Age of Onset   Breast cancer Father    Suicidality Brother      Review of Systems negative except from HPI and PMH  Physical Exam BP 112/70 (BP Location: Left Arm, Patient Position: Sitting, Cuff Size: Normal)   Pulse (!) 47   Ht 6' 4"$  (1.93 m)   Wt 207 lb (93.9 kg)   SpO2 99%   BMI 25.20 kg/m  Well developed and well nourished in no acute distress HENT normal E scleral and icterus clear Neck Supple JVP flat; carotids brisk and full Clear to ausculation Slow Regular rate and rhythm, no murmurs gallops or rub Soft with active bowel sounds No clubbing cyanosis  Edema Alert and oriented, grossly normal motor and sensory function Skin Warm and Dry  ECG sinus at 60 with Mobitz 1 second-degree AV block with 4: 3 conduction T wave inversions 2, 3, F, V4-V6 variable but present 2020  CrCl cannot be calculated (Patient's most recent lab result is older than the maximum 21 days allowed.).   Assessment and  Plan Atrial flutter typical-status post ablation   MR-moderate//LAE   First-degree AV block second-degree AV block type I   SAECG--abnormal  Electrocardiogram abnormal   The  patient continues with some conduction system disease.  Persistently abnormal ECG and no clear explanation for these; MRI has been normal.  Single average ECG has been abnormal suggesting a mild cardiomyopathic process; we will recheck it and then make determination thereafter, whether further imaging is indicated or whether we will just repeat the signal average ECG in a couple years.   Current medicines are reviewed at length with the patient today .  The patient does not  have concerns regarding medicines.

## 2022-07-13 NOTE — Patient Instructions (Signed)
Medication Instructions:  Your physician recommends that you continue on your current medications as directed. Please refer to the Current Medication list given to you today.  *If you need a refill on your cardiac medications before your next appointment, please call your pharmacy*   Lab Work: None ordered If you have labs (blood work) drawn today and your tests are completely normal, you will receive your results only by: [place] (if you have MyChart) OR A paper copy in the mail If you have any lab test that is abnormal or we need to change your treatment, we will call you to review the results.   Testing/Procedures: Signal Average EKG   Follow-Up: At [facility], you and your health needs are our priority.  As part of our continuing mission to provide you with exceptional heart care, we have created designated Provider Care Teams.  These Care Teams include your primary Cardiologist (physician) and Advanced Practice Providers (APPs -  Physician Assistants and Nurse Practitioners) who all work together to provide you with the care you need, when you need it.  We recommend signing up for the patient portal called "MyChart".  Sign up information is provided on this After Visit Summary.  MyChart is used to connect with patients for Virtual Visits (Telemedicine).  Patients are able to view lab/test results, encounter notes, upcoming appointments, etc.  Non-urgent messages can be sent to your provider as well.   To learn more about what you can do with MyChart, go to [email or web address].    Your next appointment:   As needed  Provider:   Virl Axe, MD    Other Instructions Procedure in [place] at [place]

## 2022-07-14 ENCOUNTER — Other Ambulatory Visit (HOSPITAL_COMMUNITY): Payer: 59

## 2022-07-21 ENCOUNTER — Ambulatory Visit (HOSPITAL_COMMUNITY)
Admission: RE | Admit: 2022-07-21 | Discharge: 2022-07-21 | Disposition: A | Discharge status: Home / Self Care | Payer: 59 | Source: Ambulatory Visit | Attending: Internal Medicine | Admitting: Internal Medicine

## 2022-07-21 ENCOUNTER — Other Ambulatory Visit: Payer: Self-pay

## 2022-07-21 ENCOUNTER — Other Ambulatory Visit (HOSPITAL_COMMUNITY): Payer: 59

## 2022-07-21 DIAGNOSIS — I442 Atrioventricular block, complete: Secondary | ICD-10-CM | POA: Diagnosis not present

## 2022-07-21 DIAGNOSIS — R001 Bradycardia, unspecified: Secondary | ICD-10-CM | POA: Diagnosis not present

## 2022-07-21 DIAGNOSIS — I483 Typical atrial flutter: Secondary | ICD-10-CM

## 2022-07-21 DIAGNOSIS — I517 Cardiomegaly: Secondary | ICD-10-CM | POA: Diagnosis not present

## 2022-07-24 ENCOUNTER — Telehealth: Payer: Self-pay | Admitting: Internal Medicine

## 2022-07-24 NOTE — Telephone Encounter (Signed)
Pt called to follow up on the results to his EKG

## 2022-07-24 NOTE — Telephone Encounter (Signed)
Please Inform Patient ECG showed complete heart block as we have seen before and other abnormalities which we have also seen before and for which we undertook the cardiac MRI a couple of years ago that was encouragingly relatively normal Thanks

## 2022-07-25 NOTE — Telephone Encounter (Signed)
Dr. Caryl Comes- the patient has his Signal Averaged EKG on 07/21/22, so I think this is the EKG he was referring to when he called.   I just wanted to clarify that with you before calling him as I think the EKG you were referring to below was his in office EKG.   Thanks!

## 2022-07-26 NOTE — Telephone Encounter (Signed)
Patient is calling again to follow up about his EKG.

## 2022-07-26 NOTE — Telephone Encounter (Signed)
I spoke with the patient regarding Dr. Olin Pia interpretation of his Signal averaged EKG. The patient voices understanding of these results and was appreciative of the call back.

## 2022-07-26 NOTE — Telephone Encounter (Signed)
Trevor Sprang, MD  to Me     07/26/22  9:56 AM The signal average ECG is about the same, 2 parameter are closer to normal, one is further  I would interpret that as stab

## 2022-08-11 ENCOUNTER — Other Ambulatory Visit
Admission: RE | Admit: 2022-08-11 | Discharge: 2022-08-11 | Disposition: A | Discharge status: Home / Self Care | Payer: 59 | Attending: Urology | Admitting: Urology

## 2022-08-11 ENCOUNTER — Ambulatory Visit: Payer: 59 | Admitting: Urology

## 2022-08-11 ENCOUNTER — Encounter: Payer: Self-pay | Admitting: Urology

## 2022-08-11 VITALS — BP 138/77 | HR 41 | Ht 76.0 in | Wt 207.0 lb

## 2022-08-11 DIAGNOSIS — R972 Elevated prostate specific antigen [PSA]: Secondary | ICD-10-CM | POA: Insufficient documentation

## 2022-08-11 LAB — URINALYSIS, COMPLETE (UACMP) WITH MICROSCOPIC
Bacteria, UA: NONE SEEN
Bilirubin Urine: NEGATIVE
Glucose, UA: NEGATIVE mg/dL
Hgb urine dipstick: NEGATIVE
Ketones, ur: NEGATIVE mg/dL
Leukocytes,Ua: NEGATIVE
Nitrite: NEGATIVE
Protein, ur: NEGATIVE mg/dL
RBC / HPF: NONE SEEN RBC/hpf (ref 0–5)
Specific Gravity, Urine: 1.015 (ref 1.005–1.030)
Squamous Epithelial / HPF: NONE SEEN /HPF (ref 0–5)
WBC, UA: NONE SEEN WBC/hpf (ref 0–5)
pH: 6.5 (ref 5.0–8.0)

## 2022-08-11 LAB — PSA: Prostatic Specific Antigen: 5.18 ng/mL — ABNORMAL HIGH (ref 0.00–4.00)
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
## 2022-08-14 ENCOUNTER — Telehealth: Payer: Self-pay

## 2022-08-14 DIAGNOSIS — R972 Elevated prostate specific antigen [PSA]: Secondary | ICD-10-CM

## 2022-08-14 NOTE — Telephone Encounter (Signed)
-----   Message from Hollice Espy, MD sent at 08/14/2022 11:08 AM EDT ----- PSA down slightly.  Based on our conversation, lets plan to proceed with prostate MRI and f/u with me after for additional risk stratification.  Hollice Espy, MD

## 2022-08-14 NOTE — Telephone Encounter (Signed)
Patient advised. MRI order placed. Information discussed and sent to patient via mychart message.

## 2022-08-21 ENCOUNTER — Encounter: Payer: Self-pay | Admitting: Urology

## 2022-08-21 NOTE — Telephone Encounter (Signed)
See other mychart message.

## 2022-08-29 ENCOUNTER — Ambulatory Visit: Admission: RE | Admit: 2022-08-29 | Payer: 59 | Source: Ambulatory Visit

## 2022-09-01 ENCOUNTER — Ambulatory Visit
Admission: RE | Admit: 2022-09-01 | Discharge: 2022-09-01 | Disposition: A | Discharge status: Home / Self Care | Payer: 59 | Source: Ambulatory Visit | Attending: Urology | Admitting: Urology

## 2022-09-01 DIAGNOSIS — R972 Elevated prostate specific antigen [PSA]: Secondary | ICD-10-CM | POA: Diagnosis not present

## 2022-09-01 DIAGNOSIS — K5730 Diverticulosis of large intestine without perforation or abscess without bleeding: Secondary | ICD-10-CM | POA: Diagnosis not present

## 2022-09-01 MED ORDER — GADOBUTROL 1 MMOL/ML IV SOLN
9.0000 mL | Freq: Once | INTRAVENOUS | Status: AC | PRN
  Administered 2022-09-01: 9 mL via INTRAVENOUS

## 2022-09-08 ENCOUNTER — Other Ambulatory Visit: Payer: Self-pay | Admitting: Urology

## 2022-09-08 ENCOUNTER — Ambulatory Visit: Payer: 59 | Admitting: Urology

## 2022-09-08 VITALS — BP 127/74 | HR 68

## 2022-09-08 DIAGNOSIS — N4289 Other specified disorders of prostate: Secondary | ICD-10-CM

## 2022-09-08 DIAGNOSIS — R972 Elevated prostate specific antigen [PSA]: Secondary | ICD-10-CM
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
## 2022-09-08 NOTE — Progress Notes (Signed)
Surgical Physician Order Form [facility] [place]  * Scheduling expectation : Next Available  *Length of Case:   *Clearance needed: no  *Anticoagulation Instructions: N/A  *Aspirin Instructions: N/A  *Post-op visit Date/Instructions:    *Diagnosis:  Elevated PSA  *Procedure: Transrectal prostate fusion biopsy  Additional orders: N/A  -Admit type: OUTpatient  -Anesthesia: MAC  -VTE Prophylaxis Standing Order SCD's       Other:   -Standing Lab Orders Per Anesthesia    Lab other: None  -Standing Test orders EKG/Chest x-ray per Anesthesia       Test other:   - Medications:  Gentamicin per pharmacy; Levaquin 500 mg po x 1  -Other orders:  Fleets enema AM

## 2022-09-15 ENCOUNTER — Telehealth: Payer: Self-pay

## 2022-09-15 NOTE — Telephone Encounter (Signed)
I spoke with Trevor Parker. We have discussed possible surgery dates and Monday May 6th, 2024 was agreed upon by all parties. Patient given information about surgery date, what to expect pre-operatively and post operatively.  We discussed that a Pre-Admission Testing office will be calling to set up the pre-op visit that will take place prior to surgery, and that these appointments are typically done over the phone with a Pre-Admissions RN. Informed patient that our office will communicate any additional care to be provided after surgery. Patients questions or concerns were discussed during our call. Advised to call our office should there be any additional information, questions or concerns that arise. Patient verbalized understanding.

## 2022-09-15 NOTE — Progress Notes (Signed)
   [facility] Surgical Posting Form  Surgery Date: Date: 10/09/2022  Surgeon: Dr. Vanna Scotland, MD  Inpt ( No  )   Outpt (Yes)   Obs ( No  )   Diagnosis: R97.20 Elevated Prostate Specific Antigen  -CPT: 55700, [id number], [phone]  Surgery: Transrectal Prostate Fusion Biopsy   Stop Anticoagulations: Yes and also hold ASA  Cardiac/Medical/Pulmonary Clearance needed: no  *Orders entered into EPIC  Date: 09/15/22   *Case booked in [place]  Date: 09/15/22  *Notified pt of Surgery: Date: 09/15/22  PRE-OP UA & CX: no  *Placed into Prior Authorization Work Que Date: 09/15/22  Assistant/laser/rep:Yes, Glenford Peers will be present for this case. Confirmation # [id number]

## 2022-09-22 ENCOUNTER — Encounter
Admission: RE | Admit: 2022-09-22 | Discharge: 2022-09-22 | Disposition: A | Discharge status: Home / Self Care | Payer: 59 | Source: Ambulatory Visit | Attending: Urology | Admitting: Urology

## 2022-09-22 ENCOUNTER — Telehealth: Payer: Self-pay | Admitting: *Deleted

## 2022-09-22 HISTORY — DX: Cardiomyopathy, unspecified: I42.9

## 2022-09-22 HISTORY — DX: Atrioventricular block, second degree: I44.1

## 2022-09-22 HISTORY — DX: Atrioventricular block, complete: I44.2

## 2022-09-22 HISTORY — DX: Cardiomegaly: I51.7

## 2022-09-22 HISTORY — DX: Personal history of other endocrine, nutritional and metabolic disease: Z86.39

## 2022-09-22 NOTE — Patient Instructions (Signed)
Your procedure is scheduled on:10-09-22 Monday Report to the Registration Desk on the 1st floor of the Medical Mall.Then proceed to the 2nd floor Surgery Desk To find out your arrival time, please call [phone] between 1PM - 3PM on:10-06-22 Friday If your arrival time is 6:00 am, do not arrive before that time as the Medical Mall entrance doors do not open until 6:00 am.  REMEMBER: Instructions that are not followed completely may result in serious medical risk, up to and including death; or upon the discretion of your surgeon and anesthesiologist your surgery may need to be rescheduled.  Do not eat food OR drink any liquids after midnight the night before surgery.  No gum chewing or hard candies.  One week prior to surgery:Last dose on 10-01-22  Stop Anti-inflammatories (NSAIDS) such as Advil, Aleve, Ibuprofen, Motrin, Naproxen, Naprosyn and Aspirin based products such as Excedrin, Goody's Powder, BC Powder.You may however, take Tylenol if needed for pain up until the day of surgery. Stop ANY OVER THE COUNTER supplements/vitamins 7 days prior to surgery (Multivitamin)  Do NOT take any medication the day of surgery  No Alcohol for 24 hours before or after surgery.  No Smoking including e-cigarettes for 24 hours before surgery.  No chewable tobacco products for at least 6 hours before surgery.  No nicotine patches on the day of surgery.  Do not use any "recreational" drugs for at least a week (preferably 2 weeks) before your surgery.  Please be advised that the combination of cocaine and anesthesia may have negative outcomes, up to and including death. If you test positive for cocaine, your surgery will be cancelled.  On the morning of surgery brush your teeth with toothpaste and water, you may rinse your mouth with mouthwash if you wish. Do not swallow any toothpaste or mouthwash.  Do not wear jewelry, make-up, hairpins, clips or nail polish.  Do not wear lotions, powders, or  perfumes.   Do not shave body hair from the neck down 48 hours before surgery.  Contact lenses, hearing aids and dentures may not be worn into surgery.  Do not bring valuables to the hospital. [facility] is not responsible for any missing/lost belongings or valuables.    Notify your doctor if there is any change in your medical condition (cold, fever, infection).  Wear comfortable clothing (specific to your surgery type) to the hospital.  After surgery, you can help prevent lung complications by doing breathing exercises.  Take deep breaths and cough every 1-2 hours. Your doctor may order a device called an Incentive Spirometer to help you take deep breaths. When coughing or sneezing, hold a pillow firmly against your incision with both hands. This is called "splinting." Doing this helps protect your incision. It also decreases belly discomfort.  If you are being admitted to the hospital overnight, leave your suitcase in the car. After surgery it may be brought to your room.  In case of increased patient census, it may be necessary for you, the patient, to continue your postoperative care in the Same Day Surgery department.  If you are being discharged the day of surgery, you will not be allowed to drive home. You will need a responsible individual to drive you home and stay with you for 24 hours after surgery.   If you are taking public transportation, you will need to have a responsible individual with you.  Please call the Pre-admissions Testing Dept. at [phone] if you have any questions about these instructions.  Surgery Visitation Policy:  Patients having surgery or a procedure may have two visitors.  Children under the age of 72 must have an adult with them who is not the patient.

## 2022-09-22 NOTE — Telephone Encounter (Signed)
-----   Message from Verlee Monte, NP sent at 09/22/2022  3:09 PM EDT ----- Regarding: Request for pre-operative cardiac clearance Request for pre-operative cardiac clearance:  1. What type of surgery is being performed?  PROSTATE BIOPSY WITH URONAV  2. When is this surgery scheduled?  10/09/2022  3. Type of clearance being requested (medical, pharmacy, both)? MEDICAL   4. Are there any medications that need to be held prior to surgery? NONE  5. Practice name and name of physician performing surgery?  Performing surgeon: Dr. Vanna Scotland, MD Requesting clearance: Quentin Mulling, FNP-C    6. Anesthesia type (none, local, MAC, general)? GENERAL  7. What is the office phone and fax number?   Phone: [phone] Fax: [phone]  ATTENTION: Unable to create telephone message as per your standard workflow. Directed by [facility] providers to send requests for cardiac clearance to this pool for appropriate distribution to provider covering pre-operative clearances.   Quentin Mulling, MSN, APRN, FNP-C, CEN [facility]  Peri-operative Services Nurse Practitioner Phone: [phone] 09/22/22 3:09 PM

## 2022-09-25 NOTE — Telephone Encounter (Signed)
Pt is followed by Dr. Graciela Husbands. Left message to call back to schedule for a tele pre op appt.

## 2022-09-25 NOTE — Telephone Encounter (Signed)
Primary Cardiologist:None   Preoperative team, please contact this patient and set up a phone call appointment for further preoperative risk assessment. Please obtain consent and complete medication review. Thank you for your help.   I confirm that guidance regarding antiplatelet and oral anticoagulation therapy has been completed and, if necessary, noted below.   Levi Aland, NP-C  09/25/2022, 8:16 AM [address] Office [phone] Fax [phone]

## 2022-09-26 ENCOUNTER — Telehealth: Payer: Self-pay | Admitting: *Deleted

## 2022-09-26 NOTE — Telephone Encounter (Signed)
S/w the pt and he has been scheduled for 10/03/22 @ 2:40. Med rec and consent are done.

## 2022-09-26 NOTE — Telephone Encounter (Signed)
S/w the pt and he has been scheduled for 10/03/22 @ 2:40. Med rec and consent are done.     Patient Consent for Virtual Visit        Trevor Parker has provided verbal consent on 09/26/2022 for a virtual visit (video or telephone).   CONSENT FOR VIRTUAL VISIT FOR:  Trevor Parker  By participating in this virtual visit I agree to the following:  I hereby voluntarily request, consent and authorize [facility] and its employed or contracted physicians, physician assistants, nurse practitioners or other licensed health care professionals (the Practitioner), to provide me with telemedicine health care services (the "Services") as deemed necessary by the treating Practitioner. I acknowledge and consent to receive the Services by the Practitioner via telemedicine. I understand that the telemedicine visit will involve communicating with the Practitioner through live audiovisual communication technology and the disclosure of certain medical information by electronic transmission. I acknowledge that I have been given the opportunity to request an in-person assessment or other available alternative prior to the telemedicine visit and am voluntarily participating in the telemedicine visit.  I understand that I have the right to withhold or withdraw my consent to the use of telemedicine in the course of my care at any time, without affecting my right to future care or treatment, and that the Practitioner or I may terminate the telemedicine visit at any time. I understand that I have the right to inspect all information obtained and/or recorded in the course of the telemedicine visit and may receive copies of available information for a reasonable fee.  I understand that some of the potential risks of receiving the Services via telemedicine include:  Delay or interruption in medical evaluation due to technological equipment failure or disruption; Information transmitted may not be sufficient (e.g.  poor resolution of images) to allow for appropriate medical decision making by the Practitioner; and/or  In rare instances, security protocols could fail, causing a breach of personal health information.  Furthermore, I acknowledge that it is my responsibility to provide information about my medical history, conditions and care that is complete and accurate to the best of my ability. I acknowledge that Practitioner's advice, recommendations, and/or decision may be based on factors not within their control, such as incomplete or inaccurate data provided by me or distortions of diagnostic images or specimens that may result from electronic transmissions. I understand that the practice of medicine is not an exact science and that Practitioner makes no warranties or guarantees regarding treatment outcomes. I acknowledge that a copy of this consent can be made available to me via my patient portal [facility] MyChart), or I can request a printed copy by calling the office of [facility].    I understand that my insurance will be billed for this visit.   I have read or had this consent read to me. I understand the contents of this consent, which adequately explains the benefits and risks of the Services being provided via telemedicine.  I have been provided ample opportunity to ask questions regarding this consent and the Services and have had my questions answered to my satisfaction. I give my informed consent for the services to be provided through the use of telemedicine in my medical care

## 2022-09-26 NOTE — Telephone Encounter (Signed)
Patient was returning call. Please advise ?

## 2022-10-01 NOTE — Progress Notes (Unsigned)
Virtual Visit via Telephone Note   Because of Trevor Parker's co-morbid illnesses, he is at least at moderate risk for complications without adequate follow up.  This format is felt to be most appropriate for this patient at this time.  The patient did not have access to video technology/had technical difficulties with video requiring transitioning to audio format only (telephone).  All issues noted in this document were discussed and addressed.  No physical exam could be performed with this format.  Please refer to the patient's chart for his consent to telehealth for [facility].  Evaluation Performed:  Preoperative cardiovascular risk assessment _____________   Date:  10/01/2022   Patient ID:  Trevor Parker, DOB 21-Mar-1971, MRN [id number] Patient Location:  Home Provider location:   Office  Primary Care Provider:  Bosie Clos, MD Primary Cardiologist:  None  Chief Complaint / Patient Profile   52 y.o. y/o male with a h/o cardiomyopathy, second-degree AV block type I, atrial flutter s/p flutter ablation 2019, mitral regurgitation, sinus bradycardia, who is pending prostate biopsy and presents today for telephonic preoperative cardiovascular risk assessment.  History of Present Illness    Trevor Parker is a 52 y.o. male who presents via audio/video conferencing for a telehealth visit today.  Pt was last seen in cardiology clinic on 07/13/2022 by Dr. Graciela Husbands.  At that time Trevor Parker was doing well with no complaints of chest pain, shortness of breath, peripheral edema. The patient is now pending procedure as outlined above. Since his last visit, he has no new cardiac complaints since his previous visit.  He denies chest pain, shortness of breath, lower extremity edema, fatigue, palpitations, melena, hematuria, hemoptysis, diaphoresis, weakness, presyncope, syncope, orthopnea, and PND.    Past Medical History    Past Medical History:  Diagnosis Date    Cardiomyopathy (HCC)    mild   Complete heart block by electrocardiogram (HCC)    H/O thyroid nodule    LVH (left ventricular hypertrophy)    Mobitz type 1 second degree atrioventricular block    Typical atrial flutter [facility])    Past Surgical History:  Procedure Laterality Date   A-FLUTTER ABLATION N/A 03/13/2018   Procedure: A-FLUTTER ABLATION;  Surgeon: Duke Salvia, MD;  Location: [facility] INVASIVE CV LAB;  Service: Cardiovascular;  Laterality: N/A;   BIOPSY THYROID     COLONOSCOPY WITH PROPOFOL N/A 05/06/2021   Procedure: COLONOSCOPY WITH PROPOFOL;  Surgeon: Midge Minium, MD;  Location: [facility];  Service: Endoscopy;  Laterality: N/A;    Allergies  No Known Allergies  Home Medications    Prior to Admission medications   Medication Sig Start Date End Date Taking? Authorizing Provider  fexofenadine (ALLEGRA) 30 MG tablet Take 30 mg by mouth daily as needed (Allergies).     [provider]  ibuprofen (ADVIL,MOTRIN) 200 MG tablet Take 600 mg by mouth every 8 (eight) hours as needed (pain).     [provider]  Multiple Vitamin (MULTIVITAMIN) capsule Take 1 capsule by mouth daily.    [provider]    Physical Exam    Vital Signs:  Trevor Parker does not have vital signs available for review today.  None available today  Given telephonic nature of communication, physical exam is limited. AAOx3. NAD. Normal affect.  Speech and respirations are unlabored.  Accessory Clinical Findings    None  Assessment & Plan    1.  Preoperative Cardiovascular Risk Assessment:  Patient is RCRI score is 0.4%  The patient affirms he has been doing well without any new cardiac symptoms. They are able to achieve 4 METS without cardiac limitations. Therefore, based on ACC/AHA guidelines, the patient would be at acceptable risk for the planned procedure without further cardiovascular testing. The patient was advised that if he develops new symptoms prior to  surgery to contact our office to arrange for a follow-up visit, and he verbalized understanding.   The patient was advised that if he develops new symptoms prior to surgery to contact our office to arrange for a follow-up visit, and he verbalized understanding.  No medications requested to be held  A copy of this note will be routed to requesting surgeon.  Time:   Today, I have spent 5 minutes with the patient with telehealth technology discussing medical history, symptoms, and management plan.     Napoleon Form, Leodis Rains, NP  10/01/2022, 7:04 PM

## 2022-10-03 ENCOUNTER — Ambulatory Visit: Payer: 59 | Attending: Cardiovascular Disease

## 2022-10-03 DIAGNOSIS — Z01810 Encounter for preprocedural cardiovascular examination: Secondary | ICD-10-CM | POA: Diagnosis not present

## 2022-10-04 ENCOUNTER — Encounter: Payer: Self-pay | Admitting: Urgent Care

## 2022-10-04 ENCOUNTER — Encounter: Payer: Self-pay | Admitting: Urology

## 2022-10-04 DIAGNOSIS — C61 Malignant neoplasm of prostate: Secondary | ICD-10-CM

## 2022-10-04 HISTORY — DX: Malignant neoplasm of prostate: C61

## 2022-10-05 ENCOUNTER — Encounter: Payer: Self-pay | Admitting: Urology

## 2022-10-05 DIAGNOSIS — R972 Elevated prostate specific antigen [PSA]: Secondary | ICD-10-CM

## 2022-10-06 NOTE — Telephone Encounter (Signed)
Patient called back and states he would like to cancel procedure for 10/09/22 and go to [facility] for fusion biopsy with the laughing gas. Sending this message to [facility] as FYI she is out of the office and Crystal to help with cancellation and anything else that is needed.  Dr Apolinar Junes is it ok to place referral to [facility] for this?

## 2022-10-06 NOTE — Telephone Encounter (Signed)
Referral placed to [facility]. 

## 2022-10-06 NOTE — Telephone Encounter (Signed)
Spoke with patient. Patient received a call from pre authorization/billing department with [facility] to let him know that his estimate out of pocket for the procedure on 10/09/22-fusion biopsy under anesthesia done at the hospital will be $5600 and if he goes to [facility] office in [place] and it is done in the office cost will be around $800. Advised patient the cost is higher because of having to do it in the hospital setting and with anesthesia. Advised he can go to [facility] and they can give him laughing gas but not be put under anesthesia. Patient verbalized understanding and will think about this today and give [place] a call back around lunch time to let us know how he would like to proceed.

## 2022-10-06 NOTE — Telephone Encounter (Signed)
Left message for patient to call back to discuss his message

## 2022-10-09 ENCOUNTER — Encounter: Admission: RE | Payer: Self-pay | Source: Home / Self Care

## 2022-10-09 ENCOUNTER — Ambulatory Visit: Admission: RE | Admit: 2022-10-09 | Payer: 59 | Source: Home / Self Care | Admitting: Urology

## 2022-10-09 SURGERY — BIOPSY, PROSTATE
Anesthesia: Monitor Anesthesia Care

## 2022-10-09 NOTE — Telephone Encounter (Signed)
Noted will let patient know to give [place] a call once his biopsy is scheduled so we can set up follow up appointment.

## 2022-10-17 ENCOUNTER — Ambulatory Visit: Payer: 59 | Admitting: Urology

## 2022-10-18 DIAGNOSIS — C61 Malignant neoplasm of prostate: Secondary | ICD-10-CM | POA: Diagnosis not present

## 2022-10-19 ENCOUNTER — Ambulatory Visit: Payer: 59 | Admitting: Urology

## 2022-10-24 DIAGNOSIS — C61 Malignant neoplasm of prostate: Secondary | ICD-10-CM | POA: Diagnosis not present

## 2022-10-25 ENCOUNTER — Ambulatory Visit: Payer: 59 | Admitting: Urology

## 2022-10-31 ENCOUNTER — Ambulatory Visit: Payer: 59 | Admitting: Urology

## 2022-11-08 ENCOUNTER — Other Ambulatory Visit: Payer: Self-pay | Admitting: Urology

## 2022-11-08 ENCOUNTER — Telehealth: Payer: Self-pay | Admitting: Internal Medicine

## 2022-11-08 NOTE — Telephone Encounter (Signed)
   Pre-operative Risk Assessment    Patient Name: Trevor Parker  DOB: 02-25-1971 MRN: [id number]      Request for Surgical Clearance    Procedure:   Robot - Assisted Lataroscopic Radical Prostatectomy  and Bilateral Pelvis Lymthadenectomy  Date of Surgery:  Clearance 12/21/22                                 Surgeon:  Dr. Everardo All Group or Practice Name:  [facility] Phone number:  [phone] Fax number:  [phone]   Type of Clearance Requested:   - Medical    Type of Anesthesia:  General    Additional requests/questions:  Please advise surgeon/provider what medications should be held.  Signed, Belisicia T Woods   11/08/2022, 4:29 PM

## 2022-11-09 ENCOUNTER — Encounter (HOSPITAL_COMMUNITY): Payer: Self-pay

## 2022-11-09 ENCOUNTER — Telehealth: Payer: Self-pay

## 2022-11-09 NOTE — Telephone Encounter (Signed)
Pt contacted for tele preop clearance appt. It is scheduled for 06/20 at 2pm. Med will need to be reviewed. Consent done

## 2022-11-09 NOTE — Telephone Encounter (Signed)
   Name: Tanyon Sams  DOB: November 03, 1970  MRN: [id number]  Primary Cardiologist: None  Chart reviewed as part of pre-operative protocol coverage. Because of Charlton Shepperson's past medical history and time since last visit, he will require a follow-up telephone visit in order to better assess preoperative cardiovascular risk.  Pre-op covering staff: - Please schedule appointment and call patient to inform them. If patient already had an upcoming appointment within acceptable timeframe, please add "pre-op clearance" to the appointment notes so provider is aware. - Please contact requesting surgeon's office via preferred method (i.e, phone, fax) to inform them of need for appointment prior to surgery.  No medications indicated as needing held.  Sharlene Dory, PA-C  11/09/2022, 10:18 AM

## 2022-11-09 NOTE — Telephone Encounter (Signed)
Pt contacted for tele preop clearance appt. It is scheduled for 06/20 at 2pm. Med will need to be reviewed. Consent done 

## 2022-11-10 ENCOUNTER — Telehealth: Payer: Self-pay | Admitting: Radiation Oncology

## 2022-11-10 NOTE — Telephone Encounter (Signed)
Left message for patient to call back to schedule consult per 6/6 referral. 

## 2022-11-14 NOTE — Progress Notes (Signed)
GU Location of Tumor / Histology: Prostate Cancer  If Prostate Cancer, Gleason Score is (3 + 4) and PSA is (5.18 on 08/11/2022)  Biopsies      09/01/2022 Dr. Vanna Scotland MR Prostate with/without Contrast CLINICAL DATA: Elevated PSA level of 5.18 on 08/11/2022   IMPRESSION: 1. PI-RADS category 4 lesion of the left posterolateral peripheral zone in the mid gland. Targeting data sent to [facility]. 2. Sigmoid colon diverticulosis. 3. Small amount of nonspecific free pelvic fluid.  Past/Anticipated interventions by urology, if any: NA  Past/Anticipated interventions by medical oncology, if any: NA  Weight changes, if any: {:18581}  IPSS: SHIM:  Bowel/Bladder complaints, if any: {:18581}   Nausea/Vomiting, if any: {:18581}  Pain issues, if any:  {:18581}  SAFETY ISSUES: Prior radiation? {:18581} Pacemaker/ICD? {:18581} Possible current pregnancy? Male Is the patient on methotrexate? No  Current Complaints / other details:  ***

## 2022-11-15 ENCOUNTER — Encounter: Payer: Self-pay | Admitting: Radiation Oncology

## 2022-11-15 ENCOUNTER — Ambulatory Visit
Admission: RE | Admit: 2022-11-15 | Discharge: 2022-11-15 | Disposition: A | Discharge status: Home / Self Care | Payer: 59 | Source: Ambulatory Visit | Attending: Radiation Oncology | Admitting: Radiation Oncology

## 2022-11-15 VITALS — Ht 76.0 in | Wt 204.0 lb

## 2022-11-15 DIAGNOSIS — C61 Malignant neoplasm of prostate: Secondary | ICD-10-CM
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
## 2022-11-16 NOTE — Progress Notes (Signed)
RN left message with patient with my direct number for call back with any questions, or treatment decisions.

## 2022-11-21 NOTE — Progress Notes (Signed)
Patient returned call.  RN left voicemail for call back.

## 2022-11-23 ENCOUNTER — Ambulatory Visit (INDEPENDENT_AMBULATORY_CARE_PROVIDER_SITE_OTHER): Payer: 59

## 2022-11-23 DIAGNOSIS — Z01810 Encounter for preprocedural cardiovascular examination: Secondary | ICD-10-CM

## 2022-11-23 DIAGNOSIS — C61 Malignant neoplasm of prostate: Secondary | ICD-10-CM

## 2022-11-23 NOTE — Progress Notes (Signed)
Attempted to contact patient as part of preoperative protocol.  3 messages left for patient regarding preoperative cardiac evaluation.  He will need to reschedule his preoperative phone appointment for another day and time.  Thomasene Ripple. Berley Gambrell NP-C     11/23/2022, 3:06 PM [facility] [address] Office [phone] Fax [phone]

## 2022-11-23 NOTE — Progress Notes (Signed)
RN spoke with patient and provided education on treatment options.

## 2022-11-23 NOTE — Telephone Encounter (Signed)
Patient is calling due to missing tele appt scheduled for today 06/20. He is requesting call back to reschedule.

## 2022-11-27 ENCOUNTER — Telehealth: Payer: Self-pay

## 2022-11-27 NOTE — Telephone Encounter (Signed)
Mr. Trevor Parker a prostate cancer patient called requested to speak with Trevor Fennel, PA-C about his decision to get external beam radiation therapy or brachytherapy, but had a few questions for which is the best option in his case.  RN told Trevor Parker that Trevor Parker was off and that Trevor Livings, PA-C was available to talk today he state he prefers to speak with Trevor Fennel, PA-C since he initially spoke with her at consultation on 11/15/2022.  Trevor Parker will be available to talk Tuesday or Wednesday this week.  Trevor Parker stated surgical procedure wasn't an option at this time.  Just wanted to get more information to make a choice and get started.

## 2022-11-30 NOTE — Progress Notes (Signed)
Patient was a Rad Onc consult on 6/12 for his stage T1c adenocarcinoma of the prostate with Gleason score of 3+4, and PSA of 5.18, followed by surgical consult on 6/4 with Dr. Laverle Patter.   Patient has confirmed he would like to proceed with brachytherapy.   MD's notified.  Plan of care in progress.

## 2022-11-30 NOTE — Progress Notes (Signed)
RN returned call to review treatment decision.  Left message for call back.

## 2022-12-01 ENCOUNTER — Telehealth: Payer: Self-pay | Admitting: *Deleted

## 2022-12-01 NOTE — Addendum Note (Signed)
Encounter addended by: Marcello Fennel, PA-C on: 12/01/2022 8:39 AM  Actions taken: Clinical Note Signed

## 2022-12-01 NOTE — Telephone Encounter (Signed)
CALLED PATIENT TO ASK QUESTIONS, SPOKE WITH PATIENT 

## 2022-12-04 ENCOUNTER — Ambulatory Visit: Payer: 59

## 2022-12-04 NOTE — Telephone Encounter (Signed)
I left detailed message for the pt that we will cancel the tele pre op appt today and will await a new request for the new procedure. See previous notes.

## 2022-12-04 NOTE — Telephone Encounter (Signed)
Per pre op APP today Robin Searing, NP: I was looking at Mr. Reineck the 2:00 and it looks like his procedure was canceled. Can we verify with him before I complete his pre chart?   I called the pt and he tells me that the original procedure was cancelled but they are going to do a different procedure which has not been scheduled.   I then called Dr. Vevelyn Royals office [facility] to confirm information given to me by the pt. I s/w the surgery scheduler for Dr. Laverle Patter and she confirms original procedure cancelled. New procedure has not been scheduled yet per surgery scheduler. She does tell me the new procedure is:   NEW PROCEDURE:  BRACH THERAPY SPACE OAR GELL-CYSTOSCOPY  I did ask for a new request to please be faxed to our office and to allow enough time that the pt is going to need a tele pre op appt and that we are going to cancel the once for today as it was based on the original procedure.

## 2022-12-04 NOTE — Telephone Encounter (Signed)
   Patient Name: Trevor Parker  DOB: Oct 09, 1970 MRN: [id number]  Primary Cardiologist: None  Chart reviewed as part of pre-operative protocol coverage.   Most recent note reviewed and I agree with canceling preop clearance appointment for today.  Please let me know if you have any additional questions.    Napoleon Form, Leodis Rains, NP 12/04/2022, 9:17 AM

## 2022-12-06 ENCOUNTER — Encounter: Payer: Self-pay | Admitting: Urology

## 2022-12-06 NOTE — Progress Notes (Signed)
Pre-seed nursing interview for a diagnosis of Malignant neoplasm of prostate (HCC).  Patient identity verified x2.   Patient reports doing well. No issues conveyed at this time.  Meaningful use complete.  Urinary Management medication(s)- None Urology appointment date- Pending, with Dr. Laverle Patter at [facility]  Ht 6\' 4"  (1.93 m)   Wt 200 lb (90.7 kg)   BMI 24.34 kg/m   This concludes the interaction.  Ruel Favors, LPN

## 2022-12-08 ENCOUNTER — Telehealth: Payer: Self-pay | Admitting: *Deleted

## 2022-12-08 NOTE — Telephone Encounter (Signed)
Called patient to update, spoke with patient. 

## 2022-12-12 ENCOUNTER — Encounter (HOSPITAL_COMMUNITY): Admission: RE | Admit: 2022-12-12 | Payer: 59 | Source: Ambulatory Visit

## 2022-12-13 ENCOUNTER — Telehealth: Payer: Self-pay | Admitting: *Deleted

## 2022-12-13 NOTE — Telephone Encounter (Signed)
CALLED PATIENT TO REMIND OF PRE-SEED APPTS. FOR 12-15-22, SPOKE WITH PATIENT AND HE IS AWARE OF THESE APPTS.

## 2022-12-15 ENCOUNTER — Ambulatory Visit
Admission: RE | Admit: 2022-12-15 | Discharge: 2022-12-15 | Disposition: A | Discharge status: Home / Self Care | Payer: 59 | Source: Ambulatory Visit | Attending: Radiation Oncology | Admitting: Radiation Oncology

## 2022-12-15 ENCOUNTER — Other Ambulatory Visit: Payer: Self-pay

## 2022-12-15 ENCOUNTER — Ambulatory Visit
Admission: RE | Admit: 2022-12-15 | Discharge: 2022-12-15 | Disposition: A | Discharge status: Home / Self Care | Payer: 59 | Source: Ambulatory Visit | Attending: Urology | Admitting: Urology

## 2022-12-15 ENCOUNTER — Encounter: Payer: Self-pay | Admitting: Urology

## 2022-12-15 VITALS — Ht 76.0 in | Wt 207.6 lb

## 2022-12-15 DIAGNOSIS — C61 Malignant neoplasm of prostate: Secondary | ICD-10-CM

## 2022-12-15 DIAGNOSIS — Z510 Encounter for antineoplastic radiation therapy: Secondary | ICD-10-CM | POA: Insufficient documentation

## 2022-12-15 NOTE — Progress Notes (Signed)
  Radiation Oncology         [phone] ________________________________  Name: Trevor Parker MRN: [id number]  Date: 12/15/2022  DOB: 13-Jul-1970  SIMULATION AND TREATMENT PLANNING NOTE PUBIC ARCH STUDY  EX:[id number], Ferdinand Lango, MD  Heloise Purpura, MD  DIAGNOSIS:  52 y.o. gentleman with Stage T1c adenocarcinoma of the prostate with Gleason score of 3+4, and PSA of 5.18.   Oncology History  Malignant neoplasm of prostate (HCC)  10/18/2022 Cancer Staging   Staging form: Prostate, AJCC 8th Edition - Clinical stage from 10/18/2022: Stage IIB (cT1c, cN0, cM0, PSA: 5.2, Grade Group: 2) - Signed by Marcello Fennel, PA-C on 11/15/2022 Histopathologic type: Adenocarcinoma, NOS Stage prefix: Initial diagnosis Prostate specific antigen (PSA) range: Less than 10 Gleason primary pattern: 3 Gleason secondary pattern: 4 Gleason score: 7 Histologic grading system: 5 grade system Number of biopsy cores examined: 16 Number of biopsy cores positive: 9 Location of positive needle core biopsies: One side   11/15/2022 Initial Diagnosis   Malignant neoplasm of prostate (HCC)       ICD-10-CM   1. Malignant neoplasm of prostate (HCC)  C61       COMPLEX SIMULATION:  The patient presented today for evaluation for possible prostate seed implant. He was brought to the radiation planning suite and placed supine on the CT couch. A 3-dimensional image study set was obtained in upload to the planning computer. There, on each axial slice, I contoured the prostate gland. Then, using three-dimensional radiation planning tools I reconstructed the prostate in view of the structures from the transperineal needle pathway to assess for possible pubic arch interference. In doing so, I did not appreciate any pubic arch interference. Also, the patient's prostate volume was estimated based on the drawn structure. The volume was 28 cc.  Given the pubic arch appearance and prostate volume, patient remains a good candidate to  proceed with prostate seed implant. Today, he freely provided informed written consent to proceed.  28  PLAN: The patient will undergo prostate seed implant.   ________________________________  [name]. Kathrynn Running, M.D.
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
## 2022-12-18 ENCOUNTER — Telehealth: Payer: Self-pay | Admitting: Internal Medicine

## 2022-12-18 ENCOUNTER — Other Ambulatory Visit: Payer: Self-pay | Admitting: Urology

## 2022-12-18 ENCOUNTER — Telehealth: Payer: Self-pay

## 2022-12-18 NOTE — Telephone Encounter (Signed)
  Patient Consent for Virtual Visit         Trevor Parker has provided verbal consent on 12/18/2022 for a virtual visit (video or telephone).   CONSENT FOR VIRTUAL VISIT FOR:  Trevor Parker  By participating in this virtual visit I agree to the following:  I hereby voluntarily request, consent and authorize [facility] and its employed or contracted physicians, physician assistants, nurse practitioners or other licensed health care professionals (the Practitioner), to provide me with telemedicine health care services (the "Services") as deemed necessary by the treating Practitioner. I acknowledge and consent to receive the Services by the Practitioner via telemedicine. I understand that the telemedicine visit will involve communicating with the Practitioner through live audiovisual communication technology and the disclosure of certain medical information by electronic transmission. I acknowledge that I have been given the opportunity to request an in-person assessment or other available alternative prior to the telemedicine visit and am voluntarily participating in the telemedicine visit.  I understand that I have the right to withhold or withdraw my consent to the use of telemedicine in the course of my care at any time, without affecting my right to future care or treatment, and that the Practitioner or I may terminate the telemedicine visit at any time. I understand that I have the right to inspect all information obtained and/or recorded in the course of the telemedicine visit and may receive copies of available information for a reasonable fee.  I understand that some of the potential risks of receiving the Services via telemedicine include:  Delay or interruption in medical evaluation due to technological equipment failure or disruption; Information transmitted may not be sufficient (e.g. poor resolution of images) to allow for appropriate medical decision making by the  Practitioner; and/or  In rare instances, security protocols could fail, causing a breach of personal health information.  Furthermore, I acknowledge that it is my responsibility to provide information about my medical history, conditions and care that is complete and accurate to the best of my ability. I acknowledge that Practitioner's advice, recommendations, and/or decision may be based on factors not within their control, such as incomplete or inaccurate data provided by me or distortions of diagnostic images or specimens that may result from electronic transmissions. I understand that the practice of medicine is not an exact science and that Practitioner makes no warranties or guarantees regarding treatment outcomes. I acknowledge that a copy of this consent can be made available to me via my patient portal [facility] MyChart), or I can request a printed copy by calling the office of [facility].    I understand that my insurance will be billed for this visit.   I have read or had this consent read to me. I understand the contents of this consent, which adequately explains the benefits and risks of the Services being provided via telemedicine.  I have been provided ample opportunity to ask questions regarding this consent and the Services and have had my questions answered to my satisfaction. I give my informed consent for the services to be provided through the use of telemedicine in my medical care

## 2022-12-18 NOTE — Telephone Encounter (Signed)
Patient scheduled for telehealth visit on 03/19/23. Med rec and consent done

## 2022-12-18 NOTE — Telephone Encounter (Signed)
   Name: Trevor Parker  DOB: 30-Oct-1970  MRN: [id number]  Primary Cardiologist: None   Preoperative team, please contact this patient and set up a phone call appointment for further preoperative risk assessment. Please obtain consent and complete medication review. Thank you for your help.  I confirm that guidance regarding antiplatelet and oral anticoagulation therapy has been completed and, if necessary, noted below.  None requested     Ronney Asters, NP 12/18/2022, 4:13 PM [facility]

## 2022-12-18 NOTE — Telephone Encounter (Signed)
   Pre-operative Risk Assessment    Patient Name: Trevor Parker  DOB: 07-30-1970 MRN: [id number]      Request for Surgical Clearance    Procedure:   Radiation seed implant, Cystoscopy, and insertion of  space oar hydrogel    Date of Surgery:  Clearance 04/05/23                                 Surgeon:  Dr. Everardo All Group or Practice Name:  [facility]  Phone number:  [phone]  Fax number:  [phone]    Type of Clearance Requested:   - Medical    Type of Anesthesia:  General    Additional requests/questions:    Trevor Parker   12/18/2022, 12:15 PM

## 2022-12-21 ENCOUNTER — Ambulatory Visit: Admit: 2022-12-21 | Payer: 59 | Admitting: Urology

## 2022-12-21 SURGERY — XI ROBOTIC ASSISTED LAPAROSCOPIC RADICAL PROSTATECTOMY LEVEL 2
Anesthesia: General

## 2023-03-19 ENCOUNTER — Encounter: Payer: Self-pay | Admitting: Nurse Practitioner

## 2023-03-19 ENCOUNTER — Ambulatory Visit: Payer: 59 | Attending: Nurse Practitioner | Admitting: Nurse Practitioner

## 2023-03-19 DIAGNOSIS — Z01810 Encounter for preprocedural cardiovascular examination: Secondary | ICD-10-CM

## 2023-03-19 NOTE — Progress Notes (Signed)
Virtual Visit via Telephone Note   Because of Trevor Parker's co-morbid illnesses, he is at least at moderate risk for complications without adequate follow up.  This format is felt to be most appropriate for this patient at this time.  The patient did not have access to video technology/had technical difficulties with video requiring transitioning to audio format only (telephone).  All issues noted in this document were discussed and addressed.  No physical exam could be performed with this format.  Please refer to the patient's chart for his consent to telehealth for Trevor Parker.  Evaluation Performed:  Preoperative cardiovascular risk assessment _____________   Date:  03/19/2023   Patient ID:  Trevor Parker, DOB 17-Nov-1970, MRN [id number] Patient Location:  Home Provider location:   Office  Primary Care Provider:  Bosie Clos, MD Primary Cardiologist:  None  Chief Complaint / Patient Profile   52 y.o. y/o male with a h/o atrial flutter s/p ablation 03/2018, mild cardiomyopathy, sinus bradycardia with second-degree AV block who is pending radiation seed implant, cystoscopy, and insertion of space oar hydrogel and presents today for telephonic preoperative cardiovascular risk assessment.  History of Present Illness    Trevor Parker is a 52 y.o. male who presents via audio/video conferencing for a telehealth visit today.  Pt was last seen in cardiology clinic on 07/13/2022 by Dr. Graciela Husbands.  At that time Trevor Parker was doing well.  The patient is now pending procedure as outlined above. Since his last visit, he denies chest pain, shortness of breath, lower extremity edema, fatigue, palpitations, melena, hematuria, hemoptysis, diaphoresis, weakness, presyncope, syncope, orthopnea, and PND. He is very active with running, playing basketball and weight training several days a week without concerning cardiac symptoms.    Past Medical History    Past Medical  History:  Diagnosis Date   Cardiomyopathy (HCC)    mild   Complete heart block by electrocardiogram (HCC)    a.) cMRI 07/10/2018: EF 60%. No non-compaction. mild BAE. No LGE; b.) cMRI 05/12/2019: norm EF. No LGE. Norm biventricular chamber size with qualitatively norm biventricular function. Not concerning for cardiac amyloidosis.   H/O thyroid nodule    LVH (left ventricular hypertrophy)    Mobitz type 1 second degree atrioventricular block    Typical atrial flutter (HCC)    a.) s/p A.flutter ablation 03/13/2018   Past Surgical History:  Procedure Laterality Date   A-FLUTTER ABLATION N/A 03/13/2018   Procedure: A-FLUTTER ABLATION;  Surgeon: Duke Salvia, MD;  Location: [facility] INVASIVE CV LAB;  Service: Cardiovascular;  Laterality: N/A;   BIOPSY THYROID     COLONOSCOPY WITH PROPOFOL N/A 05/06/2021   Procedure: COLONOSCOPY WITH PROPOFOL;  Surgeon: Midge Minium, MD;  Location: [facility];  Service: Endoscopy;  Laterality: N/A;   PROSTATE BIOPSY      Allergies  No Known Allergies  Home Medications    Prior to Admission medications   Medication Sig Start Date End Date Taking? Authorizing Provider  fexofenadine (ALLEGRA) 30 MG tablet Take 30 mg by mouth daily as needed (Allergies).     [provider]  ibuprofen (ADVIL,MOTRIN) 200 MG tablet Take 600 mg by mouth every 8 (eight) hours as needed (pain).     [provider]  Multiple Vitamin (MULTIVITAMIN) capsule Take 1 capsule by mouth daily.    [provider]    Physical Exam    Vital Signs:  Trevor Parker does not have vital signs available for review today.  Given telephonic  nature of communication, physical exam is limited. AAOx3. NAD. Normal affect.  Speech and respirations are unlabored.  Accessory Clinical Findings    None  Assessment & Plan    1.  Preoperative Cardiovascular Risk Assessment: According to the Revised Cardiac Risk Index (RCRI), his Perioperative Risk of Major  Cardiac Event is (%): 0.4. His Functional Capacity in METs is: 9.89 according to the Duke Activity Status Index (DASI). The patient is doing well from a cardiac perspective. Therefore, based on ACC/AHA guidelines, the patient would be at acceptable risk for the planned procedure without further cardiovascular testing.  I will forward this clearance to requesting provider.   The patient was advised that if he develops new symptoms prior to surgery to contact our office to arrange for a follow-up visit, and he verbalized understanding.  No cardiac medications requested to be held.   A copy of this note will be routed to requesting surgeon.  Time:   Today, I have spent 5 minutes with the patient with telehealth technology discussing medical history, symptoms, and management plan.    Levi Aland, NP-C  03/19/2023, 10:20 AM [address] Office [phone] Fax [phone]

## 2023-03-30 ENCOUNTER — Encounter (HOSPITAL_BASED_OUTPATIENT_CLINIC_OR_DEPARTMENT_OTHER): Payer: Self-pay | Admitting: Urology

## 2023-04-02 ENCOUNTER — Encounter (HOSPITAL_BASED_OUTPATIENT_CLINIC_OR_DEPARTMENT_OTHER): Payer: Self-pay | Admitting: Urology

## 2023-04-02 NOTE — Progress Notes (Addendum)
Addendum:  Chart reviewed w/ Dr Joline Salt MDA, via phone.  Even though Trevor Parker has cardiac clearance Trevor Parker's EKG is very usually abnormal post ablation w/ CHB.  Per Dr Joline Salt not candidate for [facility] due to this but can be done at main OR with the current cardiac clearance.   Called and spoke w/ [facility], OR scheduler for Dr Laverle Patter, informed her of anesthesia recommendation and why.  Spoke w/ via phone for pre-op interview--- Trevor Parker Lab needs dos----   no      Lab results------ current EKG in epic/ chart COVID test -----patient states asymptomatic no test needed Arrive at ------- 1100 on 04-05-2023 NPO after MN NO Solid Food.  Clear liquids from MN until--- 1000 Med rec completed Medications to take morning of surgery ----- none Diabetic medication ----- n/a Patient instructed no nail polish to be worn day of surgery Patient instructed to bring photo id and insurance card day of surgery Patient aware to have Driver (ride ) / caregiver    for 24 hours after surgery -  friend, richard Patient Special Instructions ----- will do one fleet enema morning of surgery Pre-Op special Instructions -----  Trevor Parker has telephone cardiac clearance by Eligha Bridegroom NP on 10-14-024 in epic/ chart Patient verbalized understanding of instructions that were given at this phone interview. Patient denies chest pain, sob, fever, cough at the interview.    Anesthesia Review: Typical AFlutter dx 2019 s/p ablation 10/ 2019 by dr Graciela Husbands, post ablation abnormal SAECG & conduction system dz/ cMRI normal (please refer to dr Graciela Husbands progress note 07-13-2022 about his EKG tracing)  PCP: Dr Elvera Lennox. Sullivan Lone (lov 06-28-2022) Cardiologist :  Dr Graciela Husbands Theron Arista 07-13-2022) EKG :  07-13-2022 Echo : 05-20-2018 Stress test: no Cardiac Cath : no Activity level:  denies sob w/ any activity  Blood Thinner/ Instructions /Last Dose: no ASA / Instructions/ Last Dose : no

## 2023-04-03 NOTE — Progress Notes (Signed)
COVID Vaccine received:  []  No [x]  Yes Date of any COVID positive Test in last 90 days: None  PCP - Julieanne Manson, MD  Cardiologist - Sherryl Manges, MD,  Eligha Bridegroom, NP  Cardiac clearance in 03-19-2023 Epic note.  Radiation Oncology- Margaretmary Dys, MD   Chest x-ray -  EKG - 07-21-2022  Epic  Stress Test -  ECHO - 05-20-2018  Epic Cardiac Cath -  MR cardiac Morph.- 05-15-2019  Epic  PCR screen: []  Ordered & Completed []   No Order but Needs PROFEND     [x]   N/A for this surgery  Surgery Plan:  [x]  Ambulatory   []  Outpatient in bed  []  Admit Anesthesia:    [x]  General  []  Spinal  []   Choice []   MAC  Bowel Prep - []  No  [x]   Yes _Fleet enema_AM of surgery__ patient is aware  Pacemaker / ICD device [x]  No []  Yes   Spinal Cord Stimulator:[x]  No []  Yes       History of Sleep Apnea? [x]  No []  Yes   CPAP used?- [x]  No []  Yes    Does the patient monitor blood sugar?   []  N/A   [x]  No []  Yes  Patient has: []  NO Hx DM   [x]  Pre-DM   []  DM1  []   DM2 Last A1c was: 5.7 on  12-16-2019    no Meds      Blood Thinner / Instructions:  none Aspirin Instructions:  none  ERAS Protocol Ordered: [x]  No  []  Yes Patient is to be NPO after: midnight prior per verbal order from [facility] on 04-04-23  Dental hx: []  Dentures:  [x]  N/A      []  Bridge or Partial:                   []  Loose or Damaged teeth:   Comments: per Harless Litten note: Chart reviewed w/ Dr Joline Salt MDA, via phone. Even though pt has cardiac clearance pt's EKG is very usually abnormal post ablation w/ CHB. Per Dr Joline Salt not candidate for [facility] due to this but can be done at main OR with the current cardiac clearance.   Activity level: Patient is able to climb a flight of stairs without difficulty; [x]  No CP  [x]  No SOB  Patient can perform ADLs without assistance.   Anesthesia review: CM, A.flutter- ablation 03-23-2018, abnormal EKG post ablation- bradycardia, Mobitz1,   Patient denies shortness of breath, fever, cough and  chest pain at PAT appointment.  Patient verbalized understanding and agreement to the Pre-Surgical Instructions that were given to them at this PAT appointment. Patient was also educated of the need to review these PAT instructions again prior to his surgery.I reviewed the appropriate phone numbers to call if they have any and questions or concerns.

## 2023-04-03 NOTE — Patient Instructions (Signed)
SURGICAL WAITING ROOM VISITATION Patients having surgery or a procedure may have no more than 2 support people in the waiting area - these visitors may rotate in the visitor waiting room.   Due to an increase in RSV and influenza rates and associated hospitalizations, children ages 30 and under may not visit patients in [facility] hospitals. If the patient needs to stay at the hospital during part of their recovery, the visitor guidelines for inpatient rooms apply.  PRE-OP VISITATION  Pre-op nurse will coordinate an appropriate time for 1 support person to accompany the patient in pre-op.  This support person may not rotate.  This visitor will be contacted when the time is appropriate for the visitor to come back in the pre-op area.  Please refer to the [facility] website for the visitor guidelines for Inpatients (after your surgery is over and you are in a regular room).  You are not required to quarantine at this time prior to your surgery. However, you must do this: Hand Hygiene often Do NOT share personal items Notify your provider if you are in close contact with someone who has COVID or you develop fever 100.4 or greater, new onset of sneezing, cough, sore throat, shortness of breath or body aches.  If you test positive for Covid or have been in contact with anyone that has tested positive in the last 10 days please notify you surgeon.    Your procedure is scheduled on:  THURSDAY  April 05, 2023  Report to [facility] Main Entrance: Leota Jacobsen entrance where the [employer] is available.   Report to admitting at:  10:45   AM  Call this number if you have any questions or problems the morning of surgery [phone]  Do not eat or drink anything after Midnight the night prior to your surgery/procedure.    FOLLOW ANY ADDITIONAL PRE OP INSTRUCTIONS YOU RECEIVED FROM YOUR SURGEON'S OFFICE!!!  FLEET ENEMA: Obtain one(1) Fleet Enema (sodium phosphate 7-19 gm / 118 ml enema)  and use (according to the directions on the box) the morning of your surgery.   If you have any questions, please contact your Surgeon's office for additional information.     Oral Hygiene is also important to reduce your risk of infection.        Remember - BRUSH YOUR TEETH THE MORNING OF SURGERY WITH YOUR REGULAR TOOTHPASTE  Do NOT smoke after Midnight the night before surgery.  STOP TAKING all Vitamins, Herbs and supplements 1 week before your surgery.   Take ONLY these medicines the morning of surgery with A SIP OF WATER: None   You may not have any metal on your body including  jewelry, and body piercing  Do not wear  lotions, powders, cologne, or deodorant  Men may shave face and neck.  Patients discharged on the day of surgery will not be allowed to drive home.  Someone NEEDS to stay with you for the first 24 hours after anesthesia.  Do not bring your home medications to the hospital. The Pharmacy will dispense medications listed on your medication list to you during your admission in the Hospital.   Please read over the following fact sheets you were given: IF YOU HAVE QUESTIONS ABOUT YOUR PRE-OP INSTRUCTIONS, PLEASE CALL [phone]   [facility] - Preparing for Surgery Before surgery, you can play an important role.  Because skin is not sterile, your skin needs to be as free of germs as possible.  You can reduce  the number of germs on your skin by washing with CHG (chlorahexidine gluconate) soap before surgery.  CHG is an antiseptic cleaner which kills germs and bonds with the skin to continue killing germs even after washing. Please DO NOT use if you have an allergy to CHG or antibacterial soaps.  If your skin becomes reddened/irritated stop using the CHG and inform your nurse when you arrive at Short Stay. Do not shave (including legs and underarms) for at least 48 hours prior to the first CHG shower.  You may shave your face/neck.  Please follow these instructions  carefully:  1.  Shower with CHG Soap the night before surgery and the  morning of surgery.  2.  If you choose to wash your hair, wash your hair first as usual with your normal  shampoo.  3.  After you shampoo, rinse your hair and body thoroughly to remove the shampoo.                             4.  Use CHG as you would any other liquid soap.  You can apply chg directly to the skin and wash.  Gently with a scrungie or clean washcloth.  5.  Apply the CHG Soap to your body ONLY FROM THE NECK DOWN.   Do not use on face/ open                           Wound or open sores. Avoid contact with eyes, ears mouth and genitals (private parts).                       Wash face,  Genitals (private parts) with your normal soap.             6.  Wash thoroughly, paying special attention to the area where your  surgery  will be performed.  7.  Thoroughly rinse your body with warm water from the neck down.  8.  DO NOT shower/wash with your normal soap after using and rinsing off the CHG Soap.            9.  Pat yourself dry with a clean towel.            10.  Wear clean pajamas.            11.  Place clean sheets on your bed the night of your first shower and do not  sleep with pets.  ON THE DAY OF SURGERY : Do not apply any lotions/deodorants the morning of surgery.  Please wear clean clothes to the hospital/surgery center.     FAILURE TO FOLLOW THESE INSTRUCTIONS MAY RESULT IN THE CANCELLATION OF YOUR SURGERY  PATIENT SIGNATURE_________________________________  NURSE SIGNATURE__________________________________  ________________________________________________________________________

## 2023-04-03 NOTE — Anesthesia Preprocedure Evaluation (Signed)
Anesthesia Evaluation  Patient identified by MRN, date of birth, ID band Patient awake    Reviewed: Allergy & Precautions, NPO status , Patient's Chart, lab work & pertinent test results  Airway Mallampati: II  TM Distance: >3 FB Neck ROM: Full    Dental no notable dental hx.    Pulmonary neg pulmonary ROS   Pulmonary exam normal        Cardiovascular Normal cardiovascular exam+ dysrhythmias (s/p ablation) Atrial Fibrillation   Complete heart block by electrocardiogram   Neuro/Psych   Anxiety     negative neurological ROS     GI/Hepatic negative GI ROS, Neg liver ROS,,,  Endo/Other  negative endocrine ROS    Renal/GU negative Renal ROS     Musculoskeletal  (+) Arthritis ,    Abdominal   Peds  Hematology negative hematology ROS (+)   Anesthesia Other Findings PROSTATE CANCER  Reproductive/Obstetrics                             Anesthesia Physical Anesthesia Plan  ASA: 3  Anesthesia Plan: General   Post-op Pain Management:    Induction: Intravenous  PONV Risk Score and Plan: 2 and Ondansetron, Dexamethasone, Midazolam and Treatment may vary due to age or medical condition  Airway Management Planned: Oral ETT  Additional Equipment:   Intra-op Plan:   Post-operative Plan: Extubation in OR  Informed Consent: I have reviewed the patients History and Physical, chart, labs and discussed the procedure including the risks, benefits and alternatives for the proposed anesthesia with the patient or authorized representative who has indicated his/her understanding and acceptance.     Dental advisory given  Plan Discussed with: CRNA  Anesthesia Plan Comments: (PAT note 04/04/2023)       Anesthesia Quick Evaluation

## 2023-04-03 NOTE — Progress Notes (Signed)
Anesthesia Chart Review   Case: [id number] Date/Time: 04/05/23 1245   Procedures:      RADIOACTIVE SEED IMPLANT/BRACHYTHERAPY IMPLANT - 90 MINUTES NEEDED FOR CASE     SPACE OAR INSTILLATION     CYSTOSCOPY   Anesthesia type: General   Pre-op diagnosis: PROSTATE CANCER   Location: [facility] PROCEDURE [facility] / [facility]   Surgeons: Heloise Purpura, MD       DISCUSSION:52 y.o. never smoker with h/o atrial flutter, mobitz type 1, CHB, cardiomyopathy, prostate cancer, prostate cancer scheduled for above procedure 04/05/2023 with Dr. Heloise Purpura.   Pt last seen by cardiology 03/19/2023.  Per OV note, "Preoperative Cardiovascular Risk Assessment: According to the Revised Cardiac Risk Index (RCRI), his Perioperative Risk of Major Cardiac Event is (%): 0.4. His Functional Capacity in METs is: 9.89 according to the Duke Activity Status Index (DASI). The patient is doing well from a cardiac perspective. Therefore, based on ACC/AHA guidelines, the patient would be at acceptable risk for the planned procedure without further cardiovascular testing.  I will forward this clearance to requesting provider.    The patient was advised that if he develops new symptoms prior to surgery to contact our office to arrange for a follow-up visit, and he verbalized understanding."  VS: There were no vitals taken for this visit.  PROVIDERS: Bosie Clos, MD is PCP   Sherryl Manges, MD is Cardiologist  LABS: Labs reviewed: Acceptable for surgery. (all labs ordered are listed, but only abnormal results are displayed)  Labs Reviewed - No data to display   IMAGES:   EKG:   CV: Echo 05/20/2018 - Left ventricle: The cavity size was normal. Systolic function was    normal. The estimated ejection fraction was in the range of 55%    to 60%. Wall motion was normal; there were no regional wall    motion abnormalities. Left ventricular diastolic function    parameters were normal.  - Mitral valve: There was mild  regurgitation.  - Left atrium: The atrium was mildly dilated.  - Right atrium: The atrium was mildly dilated.  - Pulmonary arteries: Systolic pressure could not be accurately    estimated.   Past Medical History:  Diagnosis Date   Cardiomyopathy (HCC)    mild   Complete heart block by electrocardiogram (HCC)    a.) cMRI 07/10/2018: EF 60%. No non-compaction. mild BAE. No LGE; b.) cMRI 05/12/2019: norm EF. No LGE. Norm biventricular chamber size with qualitatively norm biventricular function. Not concerning for cardiac amyloidosis.   Left thyroid nodule    ultrasound in epic 02-10-2014;  biospy's 02-10-2014, benign   LVH (left ventricular hypertrophy)    Malignant neoplasm prostate [facility]) 10/2022   urologist-- dr borden/  radiation oncologist-- dr Kathrynn Running;  dx 05/ 2024, gleason 3+4, psa 5.18   Mobitz type 1 second degree atrioventricular block    post op aflutter ablation   Typical atrial flutter (HCC) 12/2017   dx 07/ 2019 symptomatic wnt to pcp;  EP cardiology--- Dr Odessa Fleming--- a.) s/p A.flutter ablation 03/13/2018    Past Surgical History:  Procedure Laterality Date   A-FLUTTER ABLATION N/A 03/13/2018   Procedure: A-FLUTTER ABLATION;  Surgeon: Duke Salvia, MD;  Location: [facility] INVASIVE CV LAB;  Service: Cardiovascular;  Laterality: N/A;   COLONOSCOPY WITH PROPOFOL N/A 05/06/2021   Procedure: COLONOSCOPY WITH PROPOFOL;  Surgeon: Midge Minium, MD;  Location: [facility];  Service: Endoscopy;  Laterality: N/A;    MEDICATIONS:  fexofenadine (ALLEGRA) 180 MG tablet  ibuprofen (ADVIL,MOTRIN) 200 MG tablet   Multiple Vitamin (MULTIVITAMIN) capsule   No current facility-administered medications for this encounter.    Jodell Cipro Ward, PA-C [facility]-Surgical Testing [phone]

## 2023-04-04 ENCOUNTER — Encounter (HOSPITAL_COMMUNITY): Payer: Self-pay

## 2023-04-04 ENCOUNTER — Other Ambulatory Visit: Payer: Self-pay

## 2023-04-04 ENCOUNTER — Telehealth: Payer: Self-pay | Admitting: *Deleted

## 2023-04-04 ENCOUNTER — Encounter (HOSPITAL_COMMUNITY)
Admission: RE | Admit: 2023-04-04 | Discharge: 2023-04-04 | Disposition: A | Discharge status: Home / Self Care | Payer: 59 | Source: Ambulatory Visit | Attending: Urology | Admitting: Urology

## 2023-04-04 VITALS — BP 150/78 | HR 42 | Temp 98.0°F | Resp 14 | Ht 76.0 in | Wt 218.0 lb

## 2023-04-04 DIAGNOSIS — C61 Malignant neoplasm of prostate: Secondary | ICD-10-CM | POA: Insufficient documentation

## 2023-04-04 DIAGNOSIS — Z01812 Encounter for preprocedural laboratory examination: Secondary | ICD-10-CM | POA: Insufficient documentation

## 2023-04-04 DIAGNOSIS — I429 Cardiomyopathy, unspecified: Secondary | ICD-10-CM | POA: Diagnosis not present

## 2023-04-04 DIAGNOSIS — Z01818 Encounter for other preprocedural examination: Secondary | ICD-10-CM

## 2023-04-04 DIAGNOSIS — I4892 Unspecified atrial flutter: Secondary | ICD-10-CM | POA: Diagnosis not present

## 2023-04-04 HISTORY — DX: Unspecified osteoarthritis, unspecified site: M19.90

## 2023-04-04 HISTORY — DX: Anxiety disorder, unspecified: F41.9

## 2023-04-04 HISTORY — DX: Prediabetes: R73.03

## 2023-04-04 LAB — BASIC METABOLIC PANEL
Anion gap: 6 (ref 5–15)
BUN: 16 mg/dL (ref 6–20)
CO2: 23 mmol/L (ref 22–32)
Calcium: 8.9 mg/dL (ref 8.9–10.3)
Chloride: 110 mmol/L (ref 98–111)
Creatinine, Ser: 0.76 mg/dL (ref 0.61–1.24)
GFR, Estimated: >60 mL/min (ref >60)
Glucose, Bld: 99 mg/dL (ref 70–99)
Potassium: 4.1 mmol/L (ref 3.5–5.1)
Sodium: 139 mmol/L (ref 135–145)

## 2023-04-04 LAB — CBC
HCT: 42.8 % (ref 39.0–52.0)
Hemoglobin: 13.9 g/dL (ref 13.0–17.0)
MCH: 29.9 pg (ref 26.0–34.0)
MCHC: 32.5 g/dL (ref 30.0–36.0)
MCV: 92 fL (ref 80.0–100.0)
Platelets: 165 10*3/uL (ref 150–400)
RBC: 4.65 MIL/uL (ref 4.22–5.81)
RDW: 13.7 % (ref 11.5–15.5)
WBC: 5.1 10*3/uL (ref 4.0–10.5)
nRBC: 0 % (ref 0.0–0.2)

## 2023-04-04 NOTE — Telephone Encounter (Signed)
CALLED PATIENT TO REMIND OF PROCEDURE FOR 04-05-23, LVM FOR A RETURN CALL

## 2023-04-04 NOTE — H&P (Signed)
Office Visit Report     03/20/2023   --------------------------------------------------------------------------------   Trevor Parker  MRN: [id number]  DOB: August 17, 1970, 52 year old Male  SSN:    PRIMARY CARE:     REFERRING:  Trevor Parker  PROVIDER:  Jettie Parker, M.D.  TREATING:  Trevor Parker, [place]  LOCATION:  [facility] [phone]     --------------------------------------------------------------------------------   CC/HPI: Pt presents today for pre-operative history and physical exam in anticipation of cysto with radiation seed and space oar placement by Trevor Parker on 04/05/23. He is doing well and is without complaint.   He obtained cardiac clearance yesterday from Dr. Graciela Parker. BP is elevated today. He does not know what it is on a routine basis.   Pt denies F/C, HA, CP, SOB, N/V, diarrhea/constipation, back pain, flank pain, hematuria, and dysuria.    HX:    CC: Prostate cancer   Referring physician: Dr. Charlton Parker   Trevor Parker is a 52 year old gentleman who underwent an MRI fusion prostate biopsy on 10/18/2022 for an elevated PSA of 5.18 ng/mL. Biopsy revealed GS 3+4 = 7 in all 4 cores at the ROI (left posterior lateral peripheral zone in the mid gland), adenocarcinoma of the prostate with 9/16 total cores positive (2-90%), TRUS volume of 26 cm3. Denies new or worsening bone or back pain. Good appetite and stable weight.   Family history: Denies  Imaging studies: Prostate MRI 09/01/2022 with PI-RADS category 4 lesion in the left posterolateral peripheral zone gland. Prostate volume 29 cc. No adenopathy. No bone metastasis.   PMH: Atrial flutter s/p ablation in 03/2018  PSH: A-flutter ablation in 03/2018   TNM stage: cT1cN0Mx  PSA: 5.18  Gleason score: GS 3+4 = 7  Biopsy: 10/18/2022  Left: GS 3 + 4 = 7 in 4/4 cores at the ROI portions of the left posterior lateral peripheral zone in the mid gland; GS 3+4 = 7 at the left apex, left  mid, left lateral mid and left base. GS 3+3 = 6 in the left lateral base  Right: BPH in all cores  Prostate volume: 26 cc  PSAD: 0.19   Nomogram  CSS (15 year): 98%  PFS (5 year, 10 year): 81%, 68%  EPE: 39%  LNI: 5%  SVI: 4%   IPSS: No bothersome LUTS. IPSS is 2.  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 He is Trevor Parker's boyfriend.     ALLERGIES: None   MEDICATIONS: None   GU PSH: Prostate Needle Biopsy - 10/18/2022       PSH Notes: cardiac ablation    NON-GU PSH: Surgical Pathology, Gross And Microscopic Examination For Prostate Needle - 10/18/2022     GU PMH: Prostate Cancer - 11/07/2022, - 10/24/2022 Elevated PSA - 10/18/2022      PMH Notes: a flutter s/p ablation    NON-GU PMH: None   FAMILY HISTORY: None   SOCIAL HISTORY: Marital Status: Single Current Smoking Status: Patient has never smoked.   Tobacco Use Assessment Completed: Used Tobacco in last 30 days? Does not use smokeless tobacco. Has never drank.  Does not use drugs. Drinks 1 caffeinated drink per day. Has not had a blood transfusion.    REVIEW OF SYSTEMS:    GU Review Male:   Patient denies frequent urination, hard to postpone urination, burning/ pain with urination, get up at night to urinate, leakage of  urine, stream starts and stops, trouble starting your stream, have to strain to urinate , erection problems, and penile pain.  Gastrointestinal (Upper):   Patient denies nausea, vomiting, and indigestion/ heartburn.  Gastrointestinal (Lower):   Patient denies diarrhea and constipation.  Constitutional:   Patient denies fever, night sweats, weight loss, and fatigue.  Skin:   Patient denies skin rash/ lesion and itching.  Eyes:   Patient denies blurred vision and double vision.  Ears/ Nose/ Throat:   Patient denies sore throat and sinus problems.  Hematologic/Lymphatic:   Patient denies swollen glands and easy bruising.  Cardiovascular:   Patient  denies leg swelling and chest pains.  Respiratory:   Patient denies cough and shortness of breath.  Endocrine:   Patient denies excessive thirst.  Musculoskeletal:   Patient denies back pain and joint pain.  Neurological:   Patient denies headaches and dizziness.  Psychologic:   Patient denies depression and anxiety.   VITAL SIGNS:      03/20/2023 02:12 PM 03/20/2023 01:58 PM  BP 152/79 mmHg 157/97 mmHg  Pulse 65 /min 48 /min  Temperature   97.7 F / 36.5 C   MULTI-SYSTEM PHYSICAL EXAMINATION:    Constitutional: Well-nourished. No physical deformities. Normally developed. Good grooming.  Neck: Neck symmetrical, not swollen. Normal tracheal position.  Respiratory: Normal breath sounds. No labored breathing, no use of accessory muscles.   Cardiovascular: Regular rate and rhythm. No murmur, no gallop.   Lymphatic: No enlargement of neck, axillae, groin.  Skin: No paleness, no jaundice, no cyanosis. No lesion, no ulcer, no rash.  Neurologic / Psychiatric: Oriented to time, oriented to place, oriented to person. No depression, no anxiety, no agitation.  Gastrointestinal: No mass, no tenderness, no rigidity, non obese abdomen.  Eyes: Normal conjunctivae. Normal eyelids.  Ears, Nose, Mouth, and Throat: Left ear no scars, no lesions, no masses. Right ear no scars, no lesions, no masses. Nose no scars, no lesions, no masses. Normal hearing. Normal lips.  Musculoskeletal: Normal gait and station of head and neck.     Complexity of Data:  Records Review:   Previous Patient Records  Urine Test Review:   Urinalysis   03/20/23  Urinalysis  Urine Appearance Clear   Urine Color Yellow   Urine Glucose Neg mg/dL  Urine Bilirubin Neg mg/dL  Urine Ketones Neg mg/dL  Urine Specific Gravity 1.015   Urine Blood Neg ery/uL  Urine pH 7.0   Urine Protein Neg mg/dL  Urine Urobilinogen 0.2 mg/dL  Urine Nitrites Neg   Urine Leukocyte Esterase Neg leu/uL   PROCEDURES:          Urinalysis -  81003 Dipstick Dipstick Cont'd  Color: Yellow Bilirubin: Neg mg/dL  Appearance: Clear Ketones: Neg mg/dL  Specific Gravity: 1.610 Blood: Neg ery/uL  pH: 7.0 Protein: Neg mg/dL  Glucose: Neg mg/dL Urobilinogen: 0.2 mg/dL    Nitrites: Neg    Leukocyte Esterase: Neg leu/uL    ASSESSMENT:      ICD-10 Details  1 GU:   Prostate Cancer - C61    PLAN:           Schedule Return Visit/Planned Activity: Keep Scheduled Appointment - Schedule Surgery          Document Letter(s):  Created for Patient: Clinical Summary         Notes:   There are no changes in the patients history or physical exam since last evaluation by Trevor Parker. Pt is scheduled to undergo cysto with seed and space  oar placement on 04/05/23.   Advised pt to obtain home BP monitor and check routinely. If remains elevated he should make an appt with PCP or cards.   All pt's questions were answered to the best of my ability.          Next Appointment:      Next Appointment: 04/05/2023 01:00 PM    Appointment Type: Surgery     [facility]: [facility] [phone]    Provider: Heloise Purpura, M.D.    Reason for Visit: NE/OP BRACHYTHERAPY, Tempie Donning, CYSTO- $500.00 due by 10/24      * Signed by Trevor Amor, PA on 03/20/23 at 2:20 PM (EDT)*

## 2023-04-05 ENCOUNTER — Ambulatory Visit (HOSPITAL_COMMUNITY)
Admission: RE | Admit: 2023-04-05 | Discharge: 2023-04-05 | Disposition: A | Discharge status: Home / Self Care | Payer: 59 | Attending: Urology | Admitting: Urology

## 2023-04-05 ENCOUNTER — Ambulatory Visit (HOSPITAL_COMMUNITY): Payer: 59 | Admitting: Physician Assistant

## 2023-04-05 ENCOUNTER — Encounter (HOSPITAL_COMMUNITY)
Admission: RE | Disposition: A | Discharge status: Home / Self Care | Payer: Self-pay | Source: Home / Self Care | Attending: Urology

## 2023-04-05 ENCOUNTER — Ambulatory Visit (HOSPITAL_COMMUNITY): Payer: 59

## 2023-04-05 ENCOUNTER — Encounter (HOSPITAL_COMMUNITY): Payer: Self-pay | Admitting: Urology

## 2023-04-05 ENCOUNTER — Ambulatory Visit (HOSPITAL_BASED_OUTPATIENT_CLINIC_OR_DEPARTMENT_OTHER): Payer: 59 | Admitting: Anesthesiology

## 2023-04-05 DIAGNOSIS — I4819 Other persistent atrial fibrillation: Secondary | ICD-10-CM | POA: Insufficient documentation

## 2023-04-05 DIAGNOSIS — I4891 Unspecified atrial fibrillation: Secondary | ICD-10-CM | POA: Diagnosis not present

## 2023-04-05 DIAGNOSIS — C61 Malignant neoplasm of prostate: Secondary | ICD-10-CM | POA: Diagnosis present

## 2023-04-05 DIAGNOSIS — Z01818 Encounter for other preprocedural examination: Secondary | ICD-10-CM

## 2023-04-05 HISTORY — PX: RADIOACTIVE SEED IMPLANT: SHX5150

## 2023-04-05 HISTORY — PX: SPACE OAR INSTILLATION: SHX6769

## 2023-04-05 HISTORY — DX: Nontoxic single thyroid nodule: E04.1

## 2023-04-05 HISTORY — PX: CYSTOSCOPY: SHX5120

## 2023-04-05 SURGERY — INSERTION, RADIATION SOURCE, PROSTATE
Anesthesia: General | Site: Rectum

## 2023-04-05 MED ORDER — LIDOCAINE 2% (20 MG/ML) 5 ML SYRINGE
INTRAMUSCULAR | Status: DC | PRN
  Administered 2023-04-05: 60 mg via INTRAVENOUS

## 2023-04-05 MED ORDER — FENTANYL CITRATE (PF) 100 MCG/2ML IJ SOLN
INTRAMUSCULAR | Status: AC
  Filled 2023-04-05: qty 2

## 2023-04-05 MED ORDER — ROCURONIUM BROMIDE 100 MG/10ML IV SOLN
INTRAVENOUS | Status: DC | PRN
  Administered 2023-04-05: 50 mg via INTRAVENOUS
  Administered 2023-04-05: 20 mg via INTRAVENOUS

## 2023-04-05 MED ORDER — AMISULPRIDE (ANTIEMETIC) 5 MG/2ML IV SOLN: 10.0000 mg | Freq: Once | INTRAVENOUS | Status: DC | PRN

## 2023-04-05 MED ORDER — DEXAMETHASONE SODIUM PHOSPHATE 10 MG/ML IJ SOLN
INTRAMUSCULAR | Status: DC | PRN
  Administered 2023-04-05: 10 mg via INTRAVENOUS

## 2023-04-05 MED ORDER — TRAMADOL HCL 50 MG PO TABS: 50.0000 mg | ORAL_TABLET | Freq: Four times a day (QID) | ORAL | 0 refills | Status: DC | PRN

## 2023-04-05 MED ORDER — FENTANYL CITRATE PF 50 MCG/ML IJ SOSY
25.0000 ug | PREFILLED_SYRINGE | INTRAMUSCULAR | Status: DC | PRN
Start: 2023-04-05 — End: 2023-04-05
  Administered 2023-04-05: 50 ug via INTRAVENOUS

## 2023-04-05 MED ORDER — FENTANYL CITRATE (PF) 100 MCG/2ML IJ SOLN
INTRAMUSCULAR | Status: DC | PRN
  Administered 2023-04-05: 100 ug via INTRAVENOUS

## 2023-04-05 MED ORDER — FENTANYL CITRATE PF 50 MCG/ML IJ SOSY
PREFILLED_SYRINGE | INTRAMUSCULAR | Status: AC
  Filled 2023-04-05: qty 1

## 2023-04-05 MED ORDER — SUGAMMADEX SODIUM 200 MG/2ML IV SOLN
INTRAVENOUS | Status: DC | PRN
  Administered 2023-04-05: 200 mg via INTRAVENOUS

## 2023-04-05 MED ORDER — PROPOFOL 10 MG/ML IV BOLUS
INTRAVENOUS | Status: DC | PRN
  Administered 2023-04-05: 200 mg via INTRAVENOUS

## 2023-04-05 MED ORDER — IOHEXOL 300 MG/ML  SOLN
INTRAMUSCULAR | Status: DC | PRN
  Administered 2023-04-05: 7 mL

## 2023-04-05 MED ORDER — SODIUM CHLORIDE (PF) 0.9 % IJ SOLN
INTRAMUSCULAR | Status: AC
  Filled 2023-04-05: qty 10

## 2023-04-05 MED ORDER — TAMSULOSIN HCL 0.4 MG PO CAPS: 0.4000 mg | ORAL_CAPSULE | Freq: Every day | ORAL | 0 refills | Status: AC

## 2023-04-05 MED ORDER — FLEET ENEMA 7-19 GM/118ML RE ENEM
1.0000 | ENEMA | Freq: Once | RECTAL | Status: DC
  Filled 2023-04-05: qty 1

## 2023-04-05 MED ORDER — SODIUM CHLORIDE (PF) 0.9 % IJ SOLN
INTRAMUSCULAR | Status: DC | PRN
  Administered 2023-04-05: 3 mL

## 2023-04-05 MED ORDER — KETOROLAC TROMETHAMINE 30 MG/ML IJ SOLN
INTRAMUSCULAR | Status: AC
  Filled 2023-04-05: qty 1

## 2023-04-05 MED ORDER — CHLORHEXIDINE GLUCONATE 0.12 % MT SOLN
15.0000 mL | Freq: Once | OROMUCOSAL | Status: AC
  Administered 2023-04-05: 15 mL via OROMUCOSAL

## 2023-04-05 MED ORDER — OXYCODONE HCL 5 MG PO TABS
5.0000 mg | ORAL_TABLET | Freq: Once | ORAL | Status: AC | PRN
  Administered 2023-04-05: 5 mg via ORAL

## 2023-04-05 MED ORDER — EPHEDRINE SULFATE-NACL 50-0.9 MG/10ML-% IV SOSY
PREFILLED_SYRINGE | INTRAVENOUS | Status: DC | PRN
  Administered 2023-04-05 (×2): 5 mg via INTRAVENOUS

## 2023-04-05 MED ORDER — KETOROLAC TROMETHAMINE 30 MG/ML IJ SOLN
30.0000 mg | Freq: Once | INTRAMUSCULAR | Status: AC | PRN
  Administered 2023-04-05: 30 mg via INTRAVENOUS

## 2023-04-05 MED ORDER — GLYCOPYRROLATE 0.2 MG/ML IJ SOLN
INTRAMUSCULAR | Status: DC | PRN
  Administered 2023-04-05: .2 mg via INTRAVENOUS

## 2023-04-05 MED ORDER — OXYCODONE HCL 5 MG/5ML PO SOLN: 5.0000 mg | Freq: Once | ORAL | Status: AC | PRN

## 2023-04-05 MED ORDER — CIPROFLOXACIN IN D5W 400 MG/200ML IV SOLN
400.0000 mg | INTRAVENOUS | Status: DC
  Filled 2023-04-05: qty 200

## 2023-04-05 MED ORDER — ACETAMINOPHEN 10 MG/ML IV SOLN: 1000.0000 mg | Freq: Once | INTRAVENOUS | Status: DC | PRN

## 2023-04-05 MED ORDER — OXYCODONE HCL 5 MG PO TABS
ORAL_TABLET | ORAL | Status: AC
  Filled 2023-04-05: qty 1

## 2023-04-05 MED ORDER — STERILE WATER FOR IRRIGATION IR SOLN
Status: DC | PRN
  Administered 2023-04-05: 1000 mL
  Administered 2023-04-05: 3000 mL

## 2023-04-05 MED ORDER — EPHEDRINE 5 MG/ML INJ
INTRAVENOUS | Status: AC
  Filled 2023-04-05: qty 5

## 2023-04-05 MED ORDER — ONDANSETRON HCL 4 MG/2ML IJ SOLN
INTRAMUSCULAR | Status: DC | PRN
  Administered 2023-04-05: 4 mg via INTRAVENOUS

## 2023-04-05 MED ORDER — LACTATED RINGERS IV SOLN: INTRAVENOUS | Status: DC

## 2023-04-05 MED ORDER — ORAL CARE MOUTH RINSE: 15.0000 mL | Freq: Once | OROMUCOSAL | Status: AC

## 2023-04-05 SURGICAL SUPPLY — 46 items
BAG COUNTER SPONGE SURGICOUNT (BAG) IMPLANT
BAG DRN RND TRDRP ANRFLXCHMBR (UROLOGICAL SUPPLIES) ×3
BAG SPNG CNTER NS LX DISP (BAG)
BAG URINE DRAIN 2000ML AR STRL (UROLOGICAL SUPPLIES) ×3 IMPLANT
BAG URO CATCHER STRL LF (MISCELLANEOUS) ×3 IMPLANT
CATH FOLEY 2WAY SLVR 5CC 16FR (CATHETERS) ×6 IMPLANT
CATH ROBINSON RED A/P 20FR (CATHETERS) ×3 IMPLANT
CATH URETL OPEN END 6FR 70 (CATHETERS) IMPLANT
CLOTH BEACON ORANGE TIMEOUT ST (SAFETY) ×3 IMPLANT
COVER MAYO STAND STRL (DRAPES) ×3 IMPLANT
DRAPE U-SHAPE 47X51 STRL (DRAPES) ×3 IMPLANT
DRSG TEGADERM 4X4.75 (GAUZE/BANDAGES/DRESSINGS) ×3 IMPLANT
DRSG TEGADERM 6X8 (GAUZE/BANDAGES/DRESSINGS) IMPLANT
DRSG TEGADERM 8X12 (GAUZE/BANDAGES/DRESSINGS) ×3 IMPLANT
GAUZE SPONGE 4X4 12PLY STRL (GAUZE/BANDAGES/DRESSINGS) IMPLANT
GLOVE BIO SURGEON STRL SZ7.5 (GLOVE) ×3 IMPLANT
GLOVE SURG LX STRL 7.5 STRW (GLOVE) ×6 IMPLANT
GOWN STRL REUS W/ TWL XL LVL3 (GOWN DISPOSABLE) ×3 IMPLANT
GOWN STRL REUS W/TWL XL LVL3 (GOWN DISPOSABLE) ×3
GRID BRACH TEMP 18GA 2.8X3X.75 (MISCELLANEOUS) IMPLANT
GUIDEWIRE STR DUAL SENSOR (WIRE) ×3 IMPLANT
GUIDEWIRE ZIPWRE .038 STRAIGHT (WIRE) IMPLANT
HOLDER FOLEY CATH W/STRAP (MISCELLANEOUS) ×3 IMPLANT
IMPL SPACEOAR VUE SYSTEM (Spacer) ×3 IMPLANT
IMPLANT SPACEOAR SYSTEM 10ML (Spacer) ×3 IMPLANT
IMPLANT SPACEOAR VUE SYSTEM (Spacer) ×3 IMPLANT
KIT TURNOVER KIT A (KITS) IMPLANT
MANIFOLD NEPTUNE II (INSTRUMENTS) ×3 IMPLANT
MARKER SKIN DUAL TIP RULER LAB (MISCELLANEOUS) ×3 IMPLANT
NDL BRACHY 18G 5PK (NEEDLE) ×3 IMPLANT
NDL BRACHY 18G SINGLE (NEEDLE) ×6 IMPLANT
NDL PK MORGANSTERN STABILIZ (NEEDLE) IMPLANT
NEEDLE BRACHY 18G 5PK (NEEDLE) ×12
NEEDLE BRACHY 18G SINGLE (NEEDLE) ×6
NEEDLE PK MORGANSTERN STABILIZ (NEEDLE) ×3
PACK CYSTO (CUSTOM PROCEDURE TRAY) ×3 IMPLANT
PAD PREP 24X48 CUFFED NSTRL (MISCELLANEOUS) ×3 IMPLANT
PENCIL SMOKE EVACUATOR (MISCELLANEOUS) IMPLANT
SEED GOLD PRELOAD 1.2X3 (Urological Implant) IMPLANT
SURGILUBE 2OZ TUBE FLIPTOP (MISCELLANEOUS) ×3 IMPLANT
SYR 10ML LL (SYRINGE) ×3 IMPLANT
Seeds IMPLANT
TOWEL OR 17X26 10 PK STRL BLUE (TOWEL DISPOSABLE) ×3 IMPLANT
TUBING CONNECTING 10 (TUBING) ×3 IMPLANT
TUBING UROLOGY SET (TUBING) IMPLANT
UNDERPAD 30X36 HEAVY ABSORB (UNDERPADS AND DIAPERS) ×3 IMPLANT

## 2023-04-05 NOTE — Op Note (Signed)
Preoperative diagnosis: Clinically localized adenocarcinoma of the prostate (T1c Nx Mx)  Postoperative diagnosis: Clinically localized adenocarcinoma of the prostate  Procedure: 1) Transperineal placement of radioactive seeds into the prostate                    2) Cystoscopy                    3) Insertion of SpaceOAR hydrogel   Surgeon: Moody Bruins. M.D.  Radiation oncologist: Margaretmary Dys, M.D.  Anesthesia: General  EBL: Minimal  Complications: None  Indication: Trevor Parker is a 52 y.o. gentleman with clinically localized prostate cancer. After discussing management options for treatment, he elected to proceed with radiotherapy. He presents today for the above procedures. The potential risks, complications, alternative options, and expected recovery course have been discussed in detail with the patient and he has provided informed consent to proceed.  Description of procedure: The patient was taken to the operating room and general anesthesia was induced. He was administered preoperative antibiotics, placed in the dorsal lithotomy position, and prepped and draped in the usual sterile fashion. Next, intraoperative transrectal ultrasonography was utilized for real-time intraoperative planning by the radiation oncology team. Once the treatment plan was completed and the seed strands created, stranded iodine 125 radiation seeds were placed utilizing a brachytherapy perineal template. 64 radioactive iodine 125 seeds into the prostate through 19 catheter needles.  The brachytherapy template was then removed.  A site in the midline was selected on the perineum for placement of an 18 g needle with saline.  The needle was advanced above the rectum and below Denonvillier's fascia to the mid gland and confirmed to be in the midline on transverse imaging.  One cc of saline was injected confirming appropriate expansion of this space.  A total of 5 cc of saline was then injected to open the  space further bilaterally.  The saline syringe was then removed and the SpaceOAR hydrogel was injected with good distribution bilaterally. Position of the radiation seeds was confirmed on fluoroscopic imaging.  Flexible cystoscopy was then performed and no seeds were identified within the bladder.  No bladder tumors, stones, or other mucosal pathology was identified within the bladder. He tolerated the procedure well and without complications. He was able to be transferred to the recovery unit in satisfactory condition.  He was given a voiding trial in the PACU.

## 2023-04-05 NOTE — Transfer of Care (Signed)
Immediate Anesthesia Transfer of Care Note  Patient: Trevor Parker  Procedure(s) Performed: RADIOACTIVE SEED IMPLANT (64 seeds)/BRACHYTHERAPY IMPLANT (Prostate) SPACE OAR INSTILLATION (Rectum) CYSTOSCOPY (Bladder)  Patient Location: PACU  Anesthesia Type:General  Level of Consciousness: sedated, patient cooperative, and responds to stimulation  Airway & Oxygen Therapy: Patient Spontanous Breathing and Patient connected to face mask oxygen  Post-op Assessment: Report given to RN and Post -op Vital signs reviewed and stable  Post vital signs: Reviewed and stable  Last Vitals:  Vitals Value Taken Time  BP 141/79 04/05/23 1417  Temp    Pulse 77 04/05/23 1418  Resp 14 04/05/23 1418  SpO2 100 % 04/05/23 1418  Vitals shown include unfiled device data.  Last Pain:  Vitals:   04/05/23 1120  TempSrc: Oral  PainSc:          Complications: No notable events documented.

## 2023-04-05 NOTE — Progress Notes (Signed)
52 y.o. gentleman with Stage T1c adenocarcinoma of the prostate with Gleason score of 3+4, and PSA of 5.18.  Radiation Oncology         [phone]) [phone] ________________________________  Name: Trevor Parker MRN: [id number]  Date: 04/05/2023  DOB: September 06, 1970       Prostate Seed Implant  VH:QIONGEX, Ferdinand Lango, MD  No ref. provider found   DIAGNOSIS:   52 y.o. gentleman with Stage T1c adenocarcinoma of the prostate with Gleason score of 3+4, and PSA of 5.18.   Oncology History  Malignant neoplasm of prostate (HCC)  10/18/2022 Cancer Staging   Staging form: Prostate, AJCC 8th Edition - Clinical stage from 10/18/2022: Stage IIB (cT1c, cN0, cM0, PSA: 5.2, Grade Group: 2) - Signed by Marcello Fennel, PA-C on 11/15/2022 Histopathologic type: Adenocarcinoma, NOS Stage prefix: Initial diagnosis Prostate specific antigen (PSA) range: Less than 10 Gleason primary pattern: 3 Gleason secondary pattern: 4 Gleason score: 7 Histologic grading system: 5 grade system Number of biopsy cores examined: 16 Number of biopsy cores positive: 9 Location of positive needle core biopsies: One side   11/15/2022 Initial Diagnosis   Malignant neoplasm of prostate (HCC)       ICD-10-CM   1. Pre-op testing  Z01.818 CANCELED: CBG per Guidelines for Diabetes Management for Patients Undergoing Surgery ([facility], [facility], and [facility] only)    CANCELED: CBG per protocol      PROCEDURE: Insertion of radioactive I-125 seeds into the prostate gland.  RADIATION DOSE: 145 Gy, definitive therapy.  TECHNIQUE: Trevor Parker was brought to the operating room with the urologist. He was placed in the dorsolithotomy position. He was catheterized and a rectal tube was inserted. The perineum was shaved, prepped and draped. The ultrasound probe was then introduced by me into the rectum to see the prostate gland.  TREATMENT DEVICE: I attached the needle grid to the ultrasound probe stand and anchor needles were placed.  3D PLANNING: The  prostate was imaged in 3D using a sagittal sweep of the prostate probe. These images were transferred to the planning computer. There, the prostate, urethra and rectum were defined on each axial reconstructed image. Then, the software created an optimized 3D plan and a few seed positions were adjusted. The quality of the plan was reviewed using [facility] information for the target and the following two organs at risk:  Urethra and Rectum.  Then the accepted plan was printed and handed off to the radiation therapist.  Under my supervision, the custom loading of the seeds and spacers was carried out using the quick loader.  These pre-loaded needles were then placed into the needle holder.Marland Kitchen  PROSTATE VOLUME STUDY:  Using transrectal ultrasound the volume of the prostate was verified to be 30.6 cc.  SPECIAL TREATMENT PROCEDURE/SUPERVISION AND HANDLING: The pre-loaded needles were then delivered by the urologist under sagittal guidance. A total of 19 needles were used to deposit 64 seeds in the prostate gland. The individual seed activity was 0.369 mCi.  SpaceOAR:  Yes  COMPLEX SIMULATION: At the end of the procedure, an anterior radiograph of the pelvis was obtained to document seed positioning and count. Cystoscopy was performed by the urologist to check the urethra and bladder.  MICRODOSIMETRY: At the end of the procedure, the patient was emitting 0.051 mR/hr at 1 meter. Accordingly, he was considered safe for hospital discharge.  PLAN: The patient will return to the radiation oncology clinic for post implant CT dosimetry in three weeks.   ________________________________  [name] Kathrynn Running, M.D.

## 2023-04-05 NOTE — Anesthesia Procedure Notes (Signed)
Procedure Name: Intubation Date/Time: 04/05/2023 1:01 PM  Performed by: Kizzie Fantasia, CRNAPre-anesthesia Checklist: Patient identified, Emergency Drugs available, Suction available, Patient being monitored and Timeout performed Patient Re-evaluated:Patient Re-evaluated prior to induction Oxygen Delivery Method: Circle system utilized Preoxygenation: Pre-oxygenation with 100% oxygen Induction Type: IV induction Ventilation: Mask ventilation without difficulty Laryngoscope Size: Mac and 4 Grade View: Grade I Tube type: Oral Tube size: 7.5 mm Number of attempts: 1 Airway Equipment and Method: Stylet Placement Confirmation: ETT inserted through vocal cords under direct vision, positive ETCO2 and breath sounds checked- equal and bilateral Secured at: 23 cm Tube secured with: Tape Dental Injury: Teeth and Oropharynx as per pre-operative assessment

## 2023-04-05 NOTE — Interval H&P Note (Signed)
History and Physical Interval Note:  04/05/2023 12:29 PM  Trevor Parker  has presented today for surgery, with the diagnosis of PROSTATE CANCER.  The various methods of treatment have been discussed with the patient and family. After consideration of risks, benefits and other options for treatment, the patient has consented to  Procedure(s) with comments: RADIOACTIVE SEED IMPLANT/BRACHYTHERAPY IMPLANT (N/A) - 90 MINUTES NEEDED FOR CASE SPACE OAR INSTILLATION (N/A) CYSTOSCOPY (N/A) as a surgical intervention.  The patient's history has been reviewed, patient examined, no change in status, stable for surgery.  I have reviewed the patient's chart and labs.  Questions were answered to the patient's satisfaction.     [employer]

## 2023-04-05 NOTE — Discharge Instructions (Addendum)
You will be prescribed tamsulosin which is a medication to help you urinate over the next month.  You should call Dr. Vevelyn Royals office [phone]) if you feel you cannot empty your bladder well. Also, call if you develop fever > 101.  You will also be prescribed pain medication and should take an over the counter stool softener over the next week to avoid straining with bowel movements.  Followup with Dr. Laverle Patter and your radiation oncologist as scheduled.

## 2023-04-09 ENCOUNTER — Encounter (HOSPITAL_COMMUNITY): Payer: Self-pay | Admitting: Urology

## 2023-04-09 NOTE — Anesthesia Postprocedure Evaluation (Signed)
Anesthesia Post Note  Patient: Trevor Parker  Procedure(s) Performed: RADIOACTIVE SEED IMPLANT (64 seeds)/BRACHYTHERAPY IMPLANT (Prostate) SPACE OAR INSTILLATION (Rectum) CYSTOSCOPY (Bladder)     Patient location during evaluation: PACU Anesthesia Type: General Level of consciousness: awake Pain management: pain level controlled Vital Signs Assessment: post-procedure vital signs reviewed and stable Respiratory status: spontaneous breathing, nonlabored ventilation and respiratory function stable Cardiovascular status: blood pressure returned to baseline and stable Postop Assessment: no apparent nausea or vomiting Anesthetic complications: no   No notable events documented.  Last Vitals:  Vitals:   04/05/23 1500 04/05/23 1515  BP: (!) 146/90 (!) 145/87  Pulse: 60 63  Resp: 20 15  Temp: 36.7 C   SpO2: 100% 100%    Last Pain:  Vitals:   04/05/23 1515  TempSrc:   PainSc: 0-No pain                 Kendrell Lottman P Lorenna Lurry

## 2023-04-17 NOTE — Progress Notes (Signed)
RN left message encouraging patient to call back if he had any questions or concerns since recent brachytherapy.

## 2023-04-24 ENCOUNTER — Telehealth: Payer: Self-pay | Admitting: *Deleted

## 2023-04-24 NOTE — Telephone Encounter (Signed)
Called patient to remind of post seed appts. for 04-26-23, lvm for a return call

## 2023-04-25 NOTE — Progress Notes (Signed)
Radiation Oncology         [phone]) [phone] ________________________________  Name: Trevor Parker MRN: [id number]  Date: 04/26/2023  DOB: [birth year]  Post-Seed Follow-Up Visit Note  CC: Bosie Clos, MD  Heloise Purpura, MD  Diagnosis:   52 y.o. gentleman with Stage T1c adenocarcinoma of the prostate with Gleason score of 3+4, and PSA of 5.18.     ICD-10-CM   1. Malignant neoplasm of prostate (HCC)  C61       Interval Since Last Radiation:  3 weeks 04/05/23:  Insertion of radioactive I-125 seeds into the prostate gland; 145 Gy, definitive therapy with placement of SpaceOAR gel.  Narrative:  The patient returns today for routine follow-up.  He is complaining of increased urinary frequency and urinary hesitation symptoms. He filled out a questionnaire regarding urinary function today providing and overall IPSS score of 4 characterizing his symptoms as mild with increased frequency and nocturia x1 which are tolerable on Flomax daily.  His pre-implant score was 1. He denies any abdominal pain or bowel symptoms. His energy level has been sustained and overall, he is quite pleased with his progress to date.  ALLERGIES:  has No Known Allergies.  Meds: Current Outpatient Medications  Medication Sig Dispense Refill   fexofenadine (ALLEGRA) 180 MG tablet Take 180 mg by mouth daily as needed for allergies or rhinitis.     tamsulosin (FLOMAX) 0.4 MG CAPS capsule Take 1 capsule (0.4 mg total) by mouth at bedtime. 30 capsule 0   traMADol (ULTRAM) 50 MG tablet Take 1-2 tablets (50-100 mg total) by mouth every 6 (six) hours as needed (pain). 15 tablet 0   No current facility-administered medications for this visit.    Physical Findings: In general this is a well appearing African American male in no acute distress. He's alert and oriented x4 and appropriate throughout the examination. Cardiopulmonary assessment is negative for acute distress and he exhibits normal effort.   Lab  Findings: Lab Results  Component Value Date   WBC 5.1 04/04/2023   HGB 13.9 04/04/2023   HCT 42.8 04/04/2023   MCV 92.0 04/04/2023   PLT 165 04/04/2023    Radiographic Findings:  Patient underwent CT imaging in our clinic for post implant dosimetry. The CT will be reviewed by Dr. Kathrynn Running to confirm there is an adequate distribution of radioactive seeds throughout the prostate gland and ensure that there are no seeds in or near the rectum.  We suspect the final radiation plan and dosimetry will show appropriate coverage of the prostate gland. He understands that we will call and inform him of any unexpected findings on further review of his imaging and dosimetry.  Impression/Plan: 52 y.o. gentleman with Stage T1c adenocarcinoma of the prostate with Gleason score of 3+4, and PSA of 5.18.  The patient is recovering from the effects of radiation. His urinary symptoms should gradually improve over the next 4-6 months. We talked about this today. He is encouraged by his improvement already and is otherwise pleased with his outcome. We also talked about long-term follow-up for prostate cancer following seed implant. He understands that ongoing PSA determinations and digital rectal exams will help perform surveillance to rule out disease recurrence. He had a follow up visit with Dr. Laverle Patter on 04/24/23 and will have his first post-treatment PSA in 6 months. He understands what to expect with his PSA measures. Patient was also educated today about some of the long-term effects from radiation including a small risk for rectal bleeding and possibly  erectile dysfunction. We talked about some of the general management approaches to these potential complications. However, I did encourage the patient to contact our office or return at any point if he has questions or concerns related to his previous radiation and prostate cancer.    Marguarite Arbour, PA-C

## 2023-04-25 NOTE — Progress Notes (Signed)
  Radiation Oncology         [phone] ________________________________  Name: Trevor Parker MRN: [id number]  Date: 04/26/2023  DOB: [birth year]  COMPLEX SIMULATION NOTE  NARRATIVE:  The patient was brought to the CT Simulation planning suite today following prostate seed implantation approximately one month ago.  Identity was confirmed.  All relevant records and images related to the planned course of therapy were reviewed.  Then, the patient was set-up supine.  CT images were obtained.  The CT images were loaded into the planning software.  Then the prostate and rectum were contoured.  Treatment planning then occurred.  The implanted iodine 125 seeds were identified by the physics staff for projection of radiation distribution  I have requested : 3D Simulation  I have requested a DVH of the following structures: Prostate and rectum.    ________________________________  [name] Kathrynn Running, M.D.

## 2023-04-26 ENCOUNTER — Ambulatory Visit
Admission: RE | Admit: 2023-04-26 | Discharge: 2023-04-26 | Disposition: A | Discharge status: Home / Self Care | Payer: 59 | Source: Ambulatory Visit | Attending: Radiation Oncology | Admitting: Radiation Oncology

## 2023-04-26 ENCOUNTER — Ambulatory Visit
Admission: RE | Admit: 2023-04-26 | Discharge: 2023-04-26 | Disposition: A | Discharge status: Home / Self Care | Payer: 59 | Source: Ambulatory Visit | Attending: Urology | Admitting: Urology

## 2023-04-26 ENCOUNTER — Encounter: Payer: Self-pay | Admitting: Urology

## 2023-04-26 VITALS — BP 133/83 | HR 55 | Temp 97.9°F | Resp 18 | Ht 76.0 in | Wt 216.0 lb

## 2023-04-26 VITALS — BP 133/83 | HR 55 | Temp 97.9°F | Resp 16 | Ht 76.0 in | Wt 216.6 lb

## 2023-04-26 DIAGNOSIS — Z923 Personal history of irradiation: Secondary | ICD-10-CM | POA: Insufficient documentation

## 2023-04-26 DIAGNOSIS — C61 Malignant neoplasm of prostate: Secondary | ICD-10-CM | POA: Insufficient documentation

## 2023-04-26 DIAGNOSIS — Z510 Encounter for antineoplastic radiation therapy: Secondary | ICD-10-CM | POA: Insufficient documentation

## 2023-04-26 NOTE — Progress Notes (Signed)
Post-seed nursing interview for a diagnosis of Stage T1c adenocarcinoma of the prostate with Gleason score of 3+4, and PSA of 5.18.  Patient identity verified x2.   Patient reports doing well. Patient denies any related issues at this time.  Meaningful use complete.  I-PSS score- 4 - Mild SHIM score- 25 Urinary Management medication(s) Tamsulosin Urology appointment date- Pending with Dr. Laverle Patter at [facility]  Vitals- BP 133/83 (BP Location: Left Arm, Patient Position: Sitting, Cuff Size: Normal)   Pulse (!) 55   Temp 97.9 F (36.6 C) (Temporal)   Resp 18   Ht 6\' 4"  (1.93 m)   Wt 216 lb (98 kg)   SpO2 100%   BMI 26.29 kg/m   This concludes the interaction.  Ruel Favors, LPN

## 2023-04-27 ENCOUNTER — Encounter: Payer: Self-pay | Admitting: *Deleted

## 2023-04-30 ENCOUNTER — Encounter: Payer: Self-pay | Admitting: Radiation Oncology

## 2023-04-30 DIAGNOSIS — Z510 Encounter for antineoplastic radiation therapy: Secondary | ICD-10-CM | POA: Diagnosis not present

## 2023-04-30 NOTE — Progress Notes (Signed)
  Radiation Oncology         [phone]) [phone] ________________________________  Name: [name] MRN: [id number]  Date: 04/30/2023  DOB: March 26, 1971  3D Planning Note   Prostate Brachytherapy Post-Implant Dosimetry  Diagnosis: 52 y.o. gentleman with Stage T1c adenocarcinoma of the prostate with Gleason score of 3+4, and PSA of 5.18.   Narrative: On a previous date, Trevor Parker returned following prostate seed implantation for post implant planning. He underwent CT scan complex simulation to delineate the three-dimensional structures of the pelvis and demonstrate the radiation distribution.  Since that time, the seed localization, and complex isodose planning with dose volume histograms have now been completed.  Results:   Prostate Coverage - The dose of radiation delivered to the 90% or more of the prostate gland (D90) was 106.45% of the prescription dose. This exceeds our goal of greater than 90%. Rectal Sparing - The volume of rectal tissue receiving the prescription dose or higher was 0.0 cc. This falls under our thresholds tolerance of 1.0 cc.  Impression: The prostate seed implant appears to show adequate target coverage and appropriate rectal sparing.  Plan:  The patient will continue to follow with urology for ongoing PSA determinations. I would anticipate a high likelihood for local tumor control with minimal risk for rectal morbidity.  ________________________________  [name] Kathrynn Running, M.D.

## 2023-05-02 NOTE — Radiation Completion Notes (Addendum)
  Radiation Oncology         [phone]) [phone] ________________________________  Name: Trevor Parker MRN: [id number]  Date: 04/30/2023  DOB: 16-May-1971  Referring Physician: NORETTA FERRARA, M.D. Date of Service: 2023-05-02 Radiation Oncologist: Adina Barge, M.D. [facility]     RADIATION ONCOLOGY END OF TREATMENT NOTE     Diagnosis: 52 y.o. gentleman with Stage T1c adenocarcinoma of the prostate with Gleason score of 3+4, and PSA of 5.18.   Intent: Curative     ==========DELIVERED PLANS==========  Prostate Seed Implant Date: 2023-04-05   Plan Name: Prostate Seed Implant Site: Prostate Technique: Radioactive Seed Implant I-125 Mode: Brachytherapy Dose Per Fraction: 145 Gy Prescribed Dose (Delivered / Prescribed): 145 Gy / 145 Gy Prescribed Fxs (Delivered / Prescribed): 1 / 1     ==========ON TREATMENT VISIT DATES========== 2023-04-05     The patient will receive a call in about one month from the radiation oncology department. He will continue follow up with Dr. FERRARA as well.   ------------------------------------------------   Donnice Barge, MD [facility]  Radiation Oncology Direct Dial: [phone]  Fax: [phone] [email or web address]  Skype  LinkedIn

## 2023-05-11 ENCOUNTER — Encounter: Payer: Self-pay | Admitting: *Deleted

## 2023-05-22 ENCOUNTER — Inpatient Hospital Stay: Payer: 59 | Attending: Adult Health | Admitting: *Deleted

## 2023-05-22 ENCOUNTER — Encounter: Payer: Self-pay | Admitting: *Deleted

## 2023-05-22 DIAGNOSIS — C61 Malignant neoplasm of prostate: Secondary | ICD-10-CM

## 2023-05-22 NOTE — Progress Notes (Signed)
SCP reviewed and completed . Last colonoscopy was 05/06/2021. Colonoscopy to be repeated 05/2031. Pt does not take the flu vaccine by choice. Tetanus is UTD.

## 2023-05-24 ENCOUNTER — Inpatient Hospital Stay: Payer: 59 | Admitting: *Deleted

## 2024-03-11 ENCOUNTER — Ambulatory Visit: Attending: Cardiology | Admitting: Cardiology

## 2024-03-11 ENCOUNTER — Encounter: Payer: Self-pay | Admitting: Cardiology

## 2024-03-11 VITALS — BP 120/66 | HR 44 | Ht 76.0 in | Wt 201.8 lb

## 2024-03-11 DIAGNOSIS — I441 Atrioventricular block, second degree: Secondary | ICD-10-CM | POA: Diagnosis not present

## 2024-03-11 DIAGNOSIS — I483 Typical atrial flutter: Secondary | ICD-10-CM

## 2024-03-11 DIAGNOSIS — I442 Atrioventricular block, complete: Secondary | ICD-10-CM | POA: Diagnosis not present

## 2024-03-11 NOTE — Patient Instructions (Signed)
 Medication Instructions:   Your physician recommends that you continue on your current medications as directed. Please refer to the Current Medication list given to you today.    *If you need a refill on your cardiac medications before your next appointment, please call your pharmacy*  Lab Work:  No labs ordered today   If you have labs (blood work) drawn today and your tests are completely normal, you will receive your results only by: MyChart Message (if you have MyChart) OR A paper copy in the mail If you have any lab test that is abnormal or we need to change your treatment, we will call you to review the results.  Testing/Procedures:  Your physician has requested that you have an echocardiogram. Echocardiography is a painless test that uses sound waves to create images of your heart. It provides your doctor with information about the size and shape of your heart and how well your heart's chambers and valves are working.   You may receive an ultrasound enhancing agent through an IV if needed to better visualize your heart during the echo. This procedure takes approximately one hour.  There are no restrictions for this procedure.  This will take place at [address] [facility]) [address], [place] [address]  Please note: We ask at that you not bring children with you during ultrasound (echo/ vascular) testing. Due to room size and safety concerns, children are not allowed in the ultrasound rooms during exams. Our front office staff cannot provide observation of children in our lobby area while testing is being conducted. An adult accompanying a patient to their appointment will only be allowed in the ultrasound room at the discretion of the ultrasound technician under special circumstances. We apologize for any inconvenience.   Follow-Up: At [facility], you and your health needs are our priority.  As part of our continuing mission to provide you with exceptional  heart care, our providers are all part of one team.  This team includes your primary Cardiologist (physician) and Advanced Practice Providers or APPs (Physician Assistants and Nurse Practitioners) who all work together to provide you with the care you need, when you need it.  Your next appointment:   12 month(s)  Provider:   Suzann Riddle, NP    We recommend signing up for the patient portal called MyChart.  Sign up information is provided on this After Visit Summary.  MyChart is used to connect with patients for Virtual Visits (Telemedicine).  Patients are able to view lab/test results, encounter notes, upcoming appointments, etc.  Non-urgent messages can be sent to your provider as well.   To learn more about what you can do with MyChart, go to [email or web address].

## 2024-03-11 NOTE — Progress Notes (Signed)
 Electrophysiology Clinic Note    Date:  03/11/2024  Patient ID:  Trevor Parker, Trevor Parker 09-May-1971, MRN [id number] PCP:  Trevor Charlie CROME, MD  Cardiologist:  None  Electrophysiologist:  Trevor Sage, MD  Electrophysiology APP:  Trevor Voris, NP     Discussed the use of AI scribe software for clinical note transcription with the patient, who gave verbal consent to proceed.   Patient Profile    Chief Complaint: EP follow-up  History of Present Illness: Trevor Parker is a 53 y.o. 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   He last saw Dr. Sage 07/2022 at which time he remained asymptomatic of arrhythmia with great exercise tolerance. Single average EKG with CHB.   On follow-up today, he continues to feel very well. He plays basketball regularly and runs several times a week. No concerning symptoms during activity to include palpitations, chest pain, chest pressure, DOE, syncope or presyncope.      Arrhythmia/Device History No specialty comments available.    ROS:  Please see the history of present illness. All other systems are reviewed and otherwise negative.    Physical Exam    VS:  BP 120/66   Pulse (!) 44   Ht 6' 4 (1.93 m)   Wt 201 lb 12.8 oz (91.5 kg)   SpO2 (!) 86%   BMI 24.56 kg/m  BMI: Body mass index is 24.56 kg/m.           Wt Readings from Last 3 Encounters:  03/11/24 201 lb 12.8 oz (91.5 kg)  04/26/23 216 lb (98 kg)  04/26/23 216 lb 9.6 oz (98.2 kg)     GEN- The patient is well appearing, alert and oriented x 3 today.   Lungs- Clear to ausculation bilaterally, normal work of breathing.  Heart- Regular, bradycardic rate and rhythm, no murmurs, rubs or gallops Extremities- No peripheral edema, warm, dry    Studies Reviewed   Previous EP, cardiology notes.    EKG is ordered. Personal review of EKG from today shows:     EKG Interpretation Date/Time:  Tuesday March 11 2024 13:34:28 EDT Ventricular Rate:  44 PR Interval:    QRS Duration:  86 QT Interval:  410 QTC Calculation: 350 R Axis:   53  Text Interpretation: complete heart block Left ventricular hypertrophy with repolarization abnormality ( Sokolow-Lyon ) Confirmed by Trevor Parker [id number]) on 03/11/2024 3:52:52 PM    Cardiac MRI, 05/12/2019 1. Normal biventricular chamber size. Qualitatively normal biventricular function.  2. No delayed myocardial enhancement seen. No evidence of infarction, scar, fibrosis or infiltrative process.  3. Normal T1 myocardial nulling kinetics suggests against a diagnosis of cardiac amyloidosis.   Images compared to the prior study 07/10/2018, no significant change is noted.  Cardiac MRI, 07/10/2018 1.  Normal LV size and function EF 60%  2.  Prominent LV apical trabeculations does not meet criteria for non compaction  3.  Mild bi atrial enlargement  4.  No evidence of scar / infiltration on delayed gadavist  images  5.  Normal RV size and function  6.  Normal aortic root 3.6 cm  7.  No pericardial effusion  TTE, 05/20/2018 - Left ventricle: The cavity size was normal. Systolic function was    normal. The estimated ejection fraction was in the range of 55%    to 60%. Wall motion was normal; there were no regional wall  motion abnormalities. Left ventricular diastolic function    parameters were normal.  - Mitral valve: There was mild regurgitation.  - Left atrium: The atrium was mildly dilated.  - Right atrium: The atrium was mildly dilated.  - Pulmonary arteries: Systolic pressure could not be accurately    estimated.    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   The patient does not have concerns regarding his medicines.  The following changes were made today:  none  Labs/ tests ordered today include:  Orders Placed This Encounter  Procedures   EKG 12-Lead   EKG 12-Lead   ECHOCARDIOGRAM COMPLETE     Disposition: Follow up with Dr. Kennyth or EP APP in 12 months, sooner if needed. Prefer MD to establish care.   Signed, Trevor Brunkhorst, NP  03/11/24  3:54 PM  Electrophysiology [facility]

## 2024-04-03 ENCOUNTER — Ambulatory Visit: Attending: Cardiology

## 2024-04-03 ENCOUNTER — Ambulatory Visit: Payer: Self-pay | Admitting: Cardiology

## 2024-04-03 DIAGNOSIS — I483 Typical atrial flutter: Secondary | ICD-10-CM

## 2024-04-03 DIAGNOSIS — I441 Atrioventricular block, second degree: Secondary | ICD-10-CM

## 2024-04-03 DIAGNOSIS — I340 Nonrheumatic mitral (valve) insufficiency: Secondary | ICD-10-CM

## 2024-04-03 LAB — ECHOCARDIOGRAM COMPLETE
AR max vel: 3.97 cm2
AV Area VTI: 3.52 cm2
AV Area mean vel: 3.62 cm2
AV Mean grad: 4 mmHg
AV Peak grad: 6.9 mmHg
Ao pk vel: 1.31 m/s
Area-P 1/2: 4.57 cm2
S' Lateral: 3.19 cm
